# Patient Record
Sex: Female | Born: 1955 | Race: White | Hispanic: No | Marital: Married | Smoking: Never smoker
Health system: Southern US, Community
[De-identification: ages and names within clinical notes are randomized; demographics above are authoritative.]

## PROBLEM LIST (undated history)

## (undated) DIAGNOSIS — C801 Malignant (primary) neoplasm, unspecified: Secondary | ICD-10-CM

## (undated) DIAGNOSIS — C8200 Follicular lymphoma grade I, unspecified site: Secondary | ICD-10-CM

## (undated) DIAGNOSIS — E0590 Thyrotoxicosis, unspecified without thyrotoxic crisis or storm: Secondary | ICD-10-CM

## (undated) HISTORY — DX: Follicular lymphoma grade I, unspecified site: C82.00

## (undated) HISTORY — DX: Thyrotoxicosis, unspecified without thyrotoxic crisis or storm: E05.90

---

## 1997-09-11 ENCOUNTER — Ambulatory Visit (HOSPITAL_COMMUNITY): Admission: RE | Admit: 1997-09-11 | Discharge: 1997-09-11 | Payer: Self-pay | Admitting: Obstetrics & Gynecology

## 1998-06-22 ENCOUNTER — Other Ambulatory Visit: Admission: RE | Admit: 1998-06-22 | Discharge: 1998-06-22 | Payer: Self-pay | Admitting: Obstetrics & Gynecology

## 1999-01-16 ENCOUNTER — Inpatient Hospital Stay (HOSPITAL_COMMUNITY): Admission: AD | Admit: 1999-01-16 | Discharge: 1999-01-19 | Payer: Self-pay | Admitting: *Deleted

## 1999-01-16 ENCOUNTER — Encounter (INDEPENDENT_AMBULATORY_CARE_PROVIDER_SITE_OTHER): Payer: Self-pay | Admitting: Specialist

## 1999-01-20 ENCOUNTER — Encounter (HOSPITAL_COMMUNITY): Admission: RE | Admit: 1999-01-20 | Discharge: 1999-04-20 | Payer: Self-pay | Admitting: *Deleted

## 2000-03-13 ENCOUNTER — Other Ambulatory Visit: Admission: RE | Admit: 2000-03-13 | Discharge: 2000-03-13 | Payer: Self-pay | Admitting: Obstetrics & Gynecology

## 2001-03-28 ENCOUNTER — Other Ambulatory Visit: Admission: RE | Admit: 2001-03-28 | Discharge: 2001-03-28 | Payer: Self-pay | Admitting: Obstetrics & Gynecology

## 2002-05-22 ENCOUNTER — Other Ambulatory Visit: Admission: RE | Admit: 2002-05-22 | Discharge: 2002-05-22 | Payer: Self-pay | Admitting: Obstetrics & Gynecology

## 2003-07-09 ENCOUNTER — Other Ambulatory Visit: Admission: RE | Admit: 2003-07-09 | Discharge: 2003-07-09 | Payer: Self-pay | Admitting: Obstetrics & Gynecology

## 2004-12-02 ENCOUNTER — Other Ambulatory Visit: Admission: RE | Admit: 2004-12-02 | Discharge: 2004-12-02 | Payer: Self-pay | Admitting: Obstetrics & Gynecology

## 2005-01-14 ENCOUNTER — Encounter: Admission: RE | Admit: 2005-01-14 | Discharge: 2005-04-14 | Payer: Self-pay | Admitting: Family Medicine

## 2009-01-05 ENCOUNTER — Other Ambulatory Visit: Admission: RE | Admit: 2009-01-05 | Discharge: 2009-01-05 | Payer: Self-pay | Admitting: Family Medicine

## 2009-12-11 ENCOUNTER — Encounter: Admission: RE | Admit: 2009-12-11 | Discharge: 2009-12-11 | Payer: Self-pay | Admitting: Obstetrics & Gynecology

## 2009-12-15 ENCOUNTER — Ambulatory Visit: Payer: Self-pay | Admitting: Oncology

## 2009-12-18 LAB — CBC WITH DIFFERENTIAL/PLATELET
BASO%: 1.1 % (ref 0.0–2.0)
Basophils Absolute: 0.1 10*3/uL (ref 0.0–0.1)
EOS%: 1.8 % (ref 0.0–7.0)
HCT: 37.7 % (ref 34.8–46.6)
HGB: 13 g/dL (ref 11.6–15.9)
LYMPH%: 26.7 % (ref 14.0–49.7)
MCH: 30.9 pg (ref 25.1–34.0)
MCHC: 34.5 g/dL (ref 31.5–36.0)
MONO#: 0.4 10*3/uL (ref 0.1–0.9)
NEUT%: 63.1 % (ref 38.4–76.8)
Platelets: 235 10*3/uL (ref 145–400)
lymph#: 1.6 10*3/uL (ref 0.9–3.3)

## 2009-12-22 LAB — COMPREHENSIVE METABOLIC PANEL
BUN: 17 mg/dL (ref 6–23)
CO2: 24 mEq/L (ref 19–32)
Calcium: 9.9 mg/dL (ref 8.4–10.5)
Chloride: 109 mEq/L (ref 96–112)
Creatinine, Ser: 0.64 mg/dL (ref 0.40–1.20)
Total Bilirubin: 0.3 mg/dL (ref 0.3–1.2)

## 2009-12-22 LAB — BETA 2 MICROGLOBULIN, SERUM: Beta-2 Microglobulin: 1.78 mg/L — ABNORMAL HIGH (ref 1.01–1.73)

## 2009-12-22 LAB — URIC ACID: Uric Acid, Serum: 4.8 mg/dL (ref 2.4–7.0)

## 2009-12-22 LAB — LACTATE DEHYDROGENASE: LDH: 150 U/L (ref 94–250)

## 2009-12-22 LAB — HEPATITIS B CORE ANTIBODY, IGM: Hep B C IgM: NEGATIVE

## 2009-12-22 LAB — HEPATITIS C ANTIBODY: HCV Ab: NEGATIVE

## 2009-12-28 ENCOUNTER — Ambulatory Visit (HOSPITAL_COMMUNITY): Admission: RE | Admit: 2009-12-28 | Discharge: 2009-12-28 | Payer: Self-pay | Admitting: Oncology

## 2010-01-04 ENCOUNTER — Ambulatory Visit (HOSPITAL_COMMUNITY): Admission: RE | Admit: 2010-01-04 | Discharge: 2010-01-04 | Payer: Self-pay | Admitting: Oncology

## 2010-01-06 ENCOUNTER — Ambulatory Visit (HOSPITAL_COMMUNITY): Admission: RE | Admit: 2010-01-06 | Discharge: 2010-01-06 | Payer: Self-pay | Admitting: Oncology

## 2010-01-29 ENCOUNTER — Ambulatory Visit: Payer: Self-pay | Admitting: Oncology

## 2010-02-03 LAB — COMPREHENSIVE METABOLIC PANEL
ALT: 23 U/L (ref 0–35)
AST: 23 U/L (ref 0–37)
Albumin: 4.1 g/dL (ref 3.5–5.2)
Alkaline Phosphatase: 61 U/L (ref 39–117)
Potassium: 3.8 mEq/L (ref 3.5–5.3)
Sodium: 142 mEq/L (ref 135–145)
Total Bilirubin: 0.6 mg/dL (ref 0.3–1.2)
Total Protein: 7 g/dL (ref 6.0–8.3)

## 2010-02-03 LAB — CBC WITH DIFFERENTIAL/PLATELET
BASO%: 0.9 % (ref 0.0–2.0)
EOS%: 2.4 % (ref 0.0–7.0)
Eosinophils Absolute: 0.2 10*3/uL (ref 0.0–0.5)
LYMPH%: 29.2 % (ref 14.0–49.7)
MCH: 30.8 pg (ref 25.1–34.0)
MCHC: 34.1 g/dL (ref 31.5–36.0)
MCV: 90.4 fL (ref 79.5–101.0)
MONO%: 8.2 % (ref 0.0–14.0)
NEUT#: 3.9 10*3/uL (ref 1.5–6.5)
RBC: 4.28 10*6/uL (ref 3.70–5.45)
RDW: 12.7 % (ref 11.2–14.5)

## 2010-02-04 LAB — HEPATITIS B CORE ANTIBODY, IGM: Hep B C IgM: NEGATIVE

## 2010-02-04 LAB — HEPATITIS C ANTIBODY: HCV Ab: NEGATIVE

## 2010-02-05 ENCOUNTER — Ambulatory Visit (HOSPITAL_COMMUNITY): Admission: RE | Admit: 2010-02-05 | Discharge: 2010-02-05 | Payer: Self-pay | Admitting: Oncology

## 2010-02-05 ENCOUNTER — Other Ambulatory Visit: Admission: RE | Admit: 2010-02-05 | Discharge: 2010-02-05 | Payer: Self-pay | Admitting: Oncology

## 2010-02-05 LAB — PHOSPHORUS: Phosphorus: 3.2 mg/dL (ref 2.3–4.6)

## 2010-02-05 LAB — HEPATITIS B DNA, ULTRAQUANTITATIVE, PCR

## 2010-02-05 LAB — SEDIMENTATION RATE: Sed Rate: 4 mm/hr (ref 0–22)

## 2010-02-05 LAB — TSH: TSH: 1.231 u[IU]/mL (ref 0.350–4.500)

## 2010-02-06 LAB — URIC ACID: Uric Acid, Serum: 4.6 mg/dL (ref 2.4–7.0)

## 2010-02-17 LAB — CBC WITH DIFFERENTIAL/PLATELET
Basophils Absolute: 0.1 10*3/uL (ref 0.0–0.1)
Eosinophils Absolute: 0.1 10*3/uL (ref 0.0–0.5)
HCT: 41 % (ref 34.8–46.6)
HGB: 13.6 g/dL (ref 11.6–15.9)
MCH: 30 pg (ref 25.1–34.0)
NEUT#: 3.3 10*3/uL (ref 1.5–6.5)
NEUT%: 60.5 % (ref 38.4–76.8)
RDW: 12.5 % (ref 11.2–14.5)
lymph#: 1.5 10*3/uL (ref 0.9–3.3)

## 2010-02-17 LAB — COMPREHENSIVE METABOLIC PANEL
ALT: 18 U/L (ref 0–35)
AST: 19 U/L (ref 0–37)
Alkaline Phosphatase: 63 U/L (ref 39–117)
Creatinine, Ser: 0.71 mg/dL (ref 0.40–1.20)
Sodium: 139 mEq/L (ref 135–145)
Total Bilirubin: 0.3 mg/dL (ref 0.3–1.2)

## 2010-02-26 LAB — COMPREHENSIVE METABOLIC PANEL
CO2: 32 mEq/L (ref 19–32)
Calcium: 9.9 mg/dL (ref 8.4–10.5)
Chloride: 105 mEq/L (ref 96–112)
Glucose, Bld: 94 mg/dL (ref 70–99)
Sodium: 142 mEq/L (ref 135–145)
Total Bilirubin: 0.6 mg/dL (ref 0.3–1.2)
Total Protein: 6.3 g/dL (ref 6.0–8.3)

## 2010-02-26 LAB — CBC WITH DIFFERENTIAL/PLATELET
Eosinophils Absolute: 0.3 10*3/uL (ref 0.0–0.5)
HCT: 37.3 % (ref 34.8–46.6)
LYMPH%: 16.6 % (ref 14.0–49.7)
MONO#: 0.6 10*3/uL (ref 0.1–0.9)
NEUT#: 3.8 10*3/uL (ref 1.5–6.5)
NEUT%: 66.6 % (ref 38.4–76.8)
Platelets: 222 10*3/uL (ref 145–400)
RBC: 4.13 10*6/uL (ref 3.70–5.45)
WBC: 5.7 10*3/uL (ref 3.9–10.3)
lymph#: 0.9 10*3/uL (ref 0.9–3.3)

## 2010-02-26 LAB — PHOSPHORUS: Phosphorus: 3.3 mg/dL (ref 2.3–4.6)

## 2010-02-26 LAB — LACTATE DEHYDROGENASE: LDH: 175 U/L (ref 94–250)

## 2010-03-03 ENCOUNTER — Ambulatory Visit: Payer: Self-pay | Admitting: Oncology

## 2010-03-05 LAB — CBC WITH DIFFERENTIAL/PLATELET
BASO%: 0.2 % (ref 0.0–2.0)
EOS%: 2.4 % (ref 0.0–7.0)
Eosinophils Absolute: 0.2 10*3/uL (ref 0.0–0.5)
LYMPH%: 17.7 % (ref 14.0–49.7)
MCH: 30.6 pg (ref 25.1–34.0)
MCHC: 33.6 g/dL (ref 31.5–36.0)
MCV: 91.1 fL (ref 79.5–101.0)
MONO%: 8.2 % (ref 0.0–14.0)
NEUT#: 5.7 10*3/uL (ref 1.5–6.5)
Platelets: 193 10*3/uL (ref 145–400)
RBC: 3.96 10*6/uL (ref 3.70–5.45)
RDW: 12.8 % (ref 11.2–14.5)

## 2010-03-05 LAB — COMPREHENSIVE METABOLIC PANEL
ALT: 26 U/L (ref 0–35)
AST: 27 U/L (ref 0–37)
Albumin: 3.4 g/dL — ABNORMAL LOW (ref 3.5–5.2)
Alkaline Phosphatase: 63 U/L (ref 39–117)
Potassium: 3.7 mEq/L (ref 3.5–5.3)
Sodium: 142 mEq/L (ref 135–145)
Total Bilirubin: 0.3 mg/dL (ref 0.3–1.2)
Total Protein: 6.1 g/dL (ref 6.0–8.3)

## 2010-03-12 LAB — COMPREHENSIVE METABOLIC PANEL
AST: 22 U/L (ref 0–37)
Albumin: 3.3 g/dL — ABNORMAL LOW (ref 3.5–5.2)
BUN: 10 mg/dL (ref 6–23)
Calcium: 9.1 mg/dL (ref 8.4–10.5)
Chloride: 108 mEq/L (ref 96–112)
Creatinine, Ser: 0.67 mg/dL (ref 0.40–1.20)
Glucose, Bld: 108 mg/dL — ABNORMAL HIGH (ref 70–99)
Potassium: 3.6 mEq/L (ref 3.5–5.3)

## 2010-03-12 LAB — CBC WITH DIFFERENTIAL/PLATELET
Basophils Absolute: 0 10*3/uL (ref 0.0–0.1)
EOS%: 5.7 % (ref 0.0–7.0)
Eosinophils Absolute: 0.2 10*3/uL (ref 0.0–0.5)
HCT: 35.2 % (ref 34.8–46.6)
HGB: 12 g/dL (ref 11.6–15.9)
MCH: 30.9 pg (ref 25.1–34.0)
MCV: 90.8 fL (ref 79.5–101.0)
MONO%: 14.9 % — ABNORMAL HIGH (ref 0.0–14.0)
NEUT#: 1.4 10*3/uL — ABNORMAL LOW (ref 1.5–6.5)
NEUT%: 49.3 % (ref 38.4–76.8)
RDW: 12.9 % (ref 11.2–14.5)
lymph#: 0.8 10*3/uL — ABNORMAL LOW (ref 0.9–3.3)

## 2010-03-12 LAB — URIC ACID: Uric Acid, Serum: 3.5 mg/dL (ref 2.4–7.0)

## 2010-03-12 LAB — LACTATE DEHYDROGENASE: LDH: 160 U/L (ref 94–250)

## 2010-03-19 LAB — COMPREHENSIVE METABOLIC PANEL
ALT: 28 U/L (ref 0–35)
Albumin: 3.7 g/dL (ref 3.5–5.2)
Alkaline Phosphatase: 66 U/L (ref 39–117)
CO2: 32 mEq/L (ref 19–32)
Glucose, Bld: 91 mg/dL (ref 70–99)
Potassium: 3.7 mEq/L (ref 3.5–5.3)
Sodium: 141 mEq/L (ref 135–145)
Total Bilirubin: 0.6 mg/dL (ref 0.3–1.2)
Total Protein: 6.5 g/dL (ref 6.0–8.3)

## 2010-03-19 LAB — CBC WITH DIFFERENTIAL/PLATELET
BASO%: 1.9 % (ref 0.0–2.0)
Eosinophils Absolute: 0.1 10*3/uL (ref 0.0–0.5)
LYMPH%: 28.1 % (ref 14.0–49.7)
MCHC: 33.8 g/dL (ref 31.5–36.0)
MONO#: 0.8 10*3/uL (ref 0.1–0.9)
NEUT#: 3.2 10*3/uL (ref 1.5–6.5)
RBC: 4 10*6/uL (ref 3.70–5.45)
RDW: 12.9 % (ref 11.2–14.5)
WBC: 5.9 10*3/uL (ref 3.9–10.3)

## 2010-03-19 LAB — URIC ACID: Uric Acid, Serum: 3.2 mg/dL (ref 2.4–7.0)

## 2010-03-19 LAB — PHOSPHORUS: Phosphorus: 3.7 mg/dL (ref 2.3–4.6)

## 2010-03-19 LAB — LACTATE DEHYDROGENASE: LDH: 171 U/L (ref 94–250)

## 2010-04-14 ENCOUNTER — Ambulatory Visit: Payer: Self-pay | Admitting: Oncology

## 2010-04-16 LAB — CBC WITH DIFFERENTIAL/PLATELET
Basophils Absolute: 0.2 10*3/uL — ABNORMAL HIGH (ref 0.0–0.1)
Eosinophils Absolute: 0.1 10*3/uL (ref 0.0–0.5)
HGB: 12.1 g/dL (ref 11.6–15.9)
MCV: 90.3 fL (ref 79.5–101.0)
MONO#: 0.9 10*3/uL (ref 0.1–0.9)
NEUT#: 2.8 10*3/uL (ref 1.5–6.5)
RDW: 12.3 % (ref 11.2–14.5)
lymph#: 1.3 10*3/uL (ref 0.9–3.3)

## 2010-04-16 LAB — COMPREHENSIVE METABOLIC PANEL
Albumin: 3.3 g/dL — ABNORMAL LOW (ref 3.5–5.2)
BUN: 20 mg/dL (ref 6–23)
CO2: 28 mEq/L (ref 19–32)
Calcium: 9.7 mg/dL (ref 8.4–10.5)
Chloride: 105 mEq/L (ref 96–112)
Glucose, Bld: 107 mg/dL — ABNORMAL HIGH (ref 70–99)
Potassium: 4.1 mEq/L (ref 3.5–5.3)

## 2010-04-16 LAB — LACTATE DEHYDROGENASE: LDH: 139 U/L (ref 94–250)

## 2010-04-26 ENCOUNTER — Ambulatory Visit (HOSPITAL_COMMUNITY): Admission: RE | Admit: 2010-04-26 | Payer: Self-pay | Source: Home / Self Care | Admitting: Oncology

## 2010-04-29 ENCOUNTER — Ambulatory Visit (HOSPITAL_COMMUNITY)
Admission: RE | Admit: 2010-04-29 | Discharge: 2010-04-29 | Payer: Self-pay | Source: Home / Self Care | Attending: Oncology | Admitting: Oncology

## 2010-04-29 LAB — COMPREHENSIVE METABOLIC PANEL
BUN: 23 mg/dL (ref 6–23)
CO2: 29 mEq/L (ref 19–32)
Calcium: 9.8 mg/dL (ref 8.4–10.5)
Chloride: 106 mEq/L (ref 96–112)
Creatinine, Ser: 0.51 mg/dL (ref 0.40–1.20)
Glucose, Bld: 127 mg/dL — ABNORMAL HIGH (ref 70–99)
Total Bilirubin: 0.2 mg/dL — ABNORMAL LOW (ref 0.3–1.2)

## 2010-04-29 LAB — URIC ACID: Uric Acid, Serum: 4.1 mg/dL (ref 2.4–7.0)

## 2010-04-29 LAB — CBC WITH DIFFERENTIAL/PLATELET
Eosinophils Absolute: 0.1 10*3/uL (ref 0.0–0.5)
HCT: 36.9 % (ref 34.8–46.6)
LYMPH%: 25.9 % (ref 14.0–49.7)
MCV: 90.2 fL (ref 79.5–101.0)
MONO%: 17.5 % — ABNORMAL HIGH (ref 0.0–14.0)
NEUT#: 2.9 10*3/uL (ref 1.5–6.5)
NEUT%: 53.3 % (ref 38.4–76.8)
Platelets: 263 10*3/uL (ref 145–400)
RBC: 4.09 10*6/uL (ref 3.70–5.45)
nRBC: 0 % (ref 0–0)

## 2010-04-29 LAB — LACTATE DEHYDROGENASE: LDH: 112 U/L (ref 94–250)

## 2010-04-29 LAB — PHOSPHORUS: Phosphorus: 4.3 mg/dL (ref 2.3–4.6)

## 2010-05-04 ENCOUNTER — Encounter (HOSPITAL_COMMUNITY)
Admission: RE | Admit: 2010-05-04 | Discharge: 2010-06-15 | Payer: Self-pay | Source: Home / Self Care | Attending: Internal Medicine | Admitting: Internal Medicine

## 2010-05-14 ENCOUNTER — Ambulatory Visit: Payer: Self-pay | Admitting: Oncology

## 2010-05-14 LAB — CBC WITH DIFFERENTIAL/PLATELET
Basophils Absolute: 0 10*3/uL (ref 0.0–0.1)
Eosinophils Absolute: 0.3 10*3/uL (ref 0.0–0.5)
HCT: 34.9 % (ref 34.8–46.6)
HGB: 11.9 g/dL (ref 11.6–15.9)
LYMPH%: 27.9 % (ref 14.0–49.7)
MONO#: 0.6 10*3/uL (ref 0.1–0.9)
NEUT#: 3.1 10*3/uL (ref 1.5–6.5)
Platelets: 238 10*3/uL (ref 145–400)
RBC: 3.95 10*6/uL (ref 3.70–5.45)
WBC: 5.6 10*3/uL (ref 3.9–10.3)

## 2010-05-14 LAB — COMPREHENSIVE METABOLIC PANEL
ALT: 24 U/L (ref 0–35)
AST: 23 U/L (ref 0–37)
CO2: 26 mEq/L (ref 19–32)
Calcium: 9.7 mg/dL (ref 8.4–10.5)
Chloride: 106 mEq/L (ref 96–112)
Sodium: 139 mEq/L (ref 135–145)
Total Bilirubin: 0.4 mg/dL (ref 0.3–1.2)
Total Protein: 5.8 g/dL — ABNORMAL LOW (ref 6.0–8.3)

## 2010-05-14 LAB — URIC ACID: Uric Acid, Serum: 4.4 mg/dL (ref 2.4–7.0)

## 2010-06-06 ENCOUNTER — Encounter: Payer: Self-pay | Admitting: Oncology

## 2010-06-11 LAB — COMPREHENSIVE METABOLIC PANEL
AST: 21 U/L (ref 0–37)
Albumin: 3.3 g/dL — ABNORMAL LOW (ref 3.5–5.2)
Alkaline Phosphatase: 69 U/L (ref 39–117)
BUN: 19 mg/dL (ref 6–23)
Glucose, Bld: 91 mg/dL (ref 70–99)
Potassium: 4.2 mEq/L (ref 3.5–5.3)
Sodium: 139 mEq/L (ref 135–145)
Total Bilirubin: 0.3 mg/dL (ref 0.3–1.2)
Total Protein: 5.8 g/dL — ABNORMAL LOW (ref 6.0–8.3)

## 2010-06-11 LAB — CBC WITH DIFFERENTIAL/PLATELET
EOS%: 1.7 % (ref 0.0–7.0)
LYMPH%: 29.1 % (ref 14.0–49.7)
MCH: 30 pg (ref 25.1–34.0)
MCV: 90.5 fL (ref 79.5–101.0)
MONO%: 15.3 % — ABNORMAL HIGH (ref 0.0–14.0)
Platelets: 238 10*3/uL (ref 145–400)
RBC: 4.03 10*6/uL (ref 3.70–5.45)
RDW: 15.7 % — ABNORMAL HIGH (ref 11.2–14.5)

## 2010-06-11 LAB — PHOSPHORUS: Phosphorus: 3.9 mg/dL (ref 2.3–4.6)

## 2010-06-11 LAB — URIC ACID: Uric Acid, Serum: 3.9 mg/dL (ref 2.4–7.0)

## 2010-06-19 ENCOUNTER — Emergency Department (HOSPITAL_COMMUNITY): Payer: No Typology Code available for payment source

## 2010-06-19 ENCOUNTER — Inpatient Hospital Stay (HOSPITAL_COMMUNITY)
Admission: EM | Admit: 2010-06-19 | Discharge: 2010-06-22 | DRG: 086 | Disposition: A | Discharge status: Home / Self Care | Payer: No Typology Code available for payment source | Attending: General Surgery | Admitting: General Surgery

## 2010-06-19 DIAGNOSIS — C8589 Other specified types of non-Hodgkin lymphoma, extranodal and solid organ sites: Secondary | ICD-10-CM | POA: Diagnosis present

## 2010-06-19 DIAGNOSIS — S06330A Contusion and laceration of cerebrum, unspecified, without loss of consciousness, initial encounter: Principal | ICD-10-CM | POA: Diagnosis present

## 2010-06-19 DIAGNOSIS — S0633AA Contusion and laceration of cerebrum, unspecified, with loss of consciousness status unknown, initial encounter: Principal | ICD-10-CM | POA: Diagnosis present

## 2010-06-19 LAB — CBC
HCT: 45.4 % (ref 36.0–46.0)
Hemoglobin: 14.7 g/dL (ref 12.0–15.0)
MCH: 29.8 pg (ref 26.0–34.0)
MCHC: 32.4 g/dL (ref 30.0–36.0)
MCV: 91.9 fL (ref 78.0–100.0)
Platelets: 233 K/uL (ref 150–400)
RBC: 4.94 MIL/uL (ref 3.87–5.11)
RDW: 15.6 % — ABNORMAL HIGH (ref 11.5–15.5)
WBC: 7.8 10*3/uL (ref 4.0–10.5)

## 2010-06-19 LAB — DIFFERENTIAL
Basophils Absolute: 0.2 K/uL — ABNORMAL HIGH (ref 0.0–0.1)
Basophils Relative: 2 % — ABNORMAL HIGH (ref 0–1)
Eosinophils Absolute: 0.2 K/uL (ref 0.0–0.7)
Eosinophils Relative: 3 % (ref 0–5)
Lymphocytes Relative: 16 % (ref 12–46)
Lymphs Abs: 1.2 K/uL (ref 0.7–4.0)
Monocytes Absolute: 0.5 10*3/uL (ref 0.1–1.0)
Monocytes Relative: 7 % (ref 3–12)
Neutro Abs: 5.7 10*3/uL (ref 1.7–7.7)
Neutrophils Relative %: 73 % (ref 43–77)

## 2010-06-19 LAB — COMPREHENSIVE METABOLIC PANEL
AST: 27 U/L (ref 0–37)
Albumin: 4 g/dL (ref 3.5–5.2)
Alkaline Phosphatase: 105 U/L (ref 39–117)
Chloride: 100 mEq/L (ref 96–112)
GFR calc Af Amer: >60 mL/min (ref >60)
Potassium: 3.8 mEq/L (ref 3.5–5.1)
Sodium: 137 mEq/L (ref 135–145)
Total Bilirubin: 0.7 mg/dL (ref 0.3–1.2)

## 2010-06-19 LAB — COMPREHENSIVE METABOLIC PANEL WITH GFR
ALT: 22 U/L (ref 0–35)
BUN: 10 mg/dL (ref 6–23)
CO2: 27 meq/L (ref 19–32)
Calcium: 9.3 mg/dL (ref 8.4–10.5)
Creatinine, Ser: 0.7 mg/dL (ref 0.4–1.2)
GFR calc non Af Amer: >60 mL/min (ref >60)
Glucose, Bld: 120 mg/dL — ABNORMAL HIGH (ref 70–99)
Total Protein: 7 g/dL (ref 6.0–8.3)

## 2010-06-19 LAB — PROTIME-INR
INR: 0.88 (ref 0.00–1.49)
Prothrombin Time: 12.1 s (ref 11.6–15.2)

## 2010-06-19 LAB — APTT: aPTT: 25 seconds (ref 24–37)

## 2010-06-20 ENCOUNTER — Encounter (HOSPITAL_COMMUNITY): Payer: Self-pay | Admitting: Radiology

## 2010-06-20 ENCOUNTER — Inpatient Hospital Stay (HOSPITAL_COMMUNITY): Payer: No Typology Code available for payment source

## 2010-06-20 LAB — BASIC METABOLIC PANEL
CO2: 25 mEq/L (ref 19–32)
Calcium: 9 mg/dL (ref 8.4–10.5)
Creatinine, Ser: 0.68 mg/dL (ref 0.4–1.2)
Glucose, Bld: 126 mg/dL — ABNORMAL HIGH (ref 70–99)

## 2010-06-21 LAB — BASIC METABOLIC PANEL
BUN: 7 mg/dL (ref 6–23)
CO2: 25 mEq/L (ref 19–32)
Calcium: 8.9 mg/dL (ref 8.4–10.5)
Creatinine, Ser: 0.61 mg/dL (ref 0.4–1.2)
GFR calc Af Amer: >60 mL/min (ref >60)

## 2010-06-21 LAB — CBC
Hemoglobin: 13.1 g/dL (ref 12.0–15.0)
MCH: 29.2 pg (ref 26.0–34.0)
MCHC: 31.9 g/dL (ref 30.0–36.0)
MCV: 91.7 fL (ref 78.0–100.0)

## 2010-06-22 ENCOUNTER — Inpatient Hospital Stay (HOSPITAL_COMMUNITY): Payer: No Typology Code available for payment source

## 2010-06-23 ENCOUNTER — Other Ambulatory Visit: Payer: Self-pay | Admitting: Neurosurgery

## 2010-06-23 DIAGNOSIS — I619 Nontraumatic intracerebral hemorrhage, unspecified: Secondary | ICD-10-CM

## 2010-06-30 ENCOUNTER — Ambulatory Visit
Admission: RE | Admit: 2010-06-30 | Discharge: 2010-06-30 | Disposition: A | Discharge status: Home / Self Care | Payer: BC Managed Care – PPO | Source: Ambulatory Visit | Attending: Neurosurgery | Admitting: Neurosurgery

## 2010-06-30 DIAGNOSIS — I619 Nontraumatic intracerebral hemorrhage, unspecified: Secondary | ICD-10-CM

## 2010-07-09 ENCOUNTER — Encounter (HOSPITAL_BASED_OUTPATIENT_CLINIC_OR_DEPARTMENT_OTHER): Payer: BC Managed Care – PPO | Admitting: Oncology

## 2010-07-09 ENCOUNTER — Other Ambulatory Visit: Payer: Self-pay | Admitting: Oncology

## 2010-07-09 DIAGNOSIS — E8809 Other disorders of plasma-protein metabolism, not elsewhere classified: Secondary | ICD-10-CM

## 2010-07-09 DIAGNOSIS — E041 Nontoxic single thyroid nodule: Secondary | ICD-10-CM

## 2010-07-09 DIAGNOSIS — R079 Chest pain, unspecified: Secondary | ICD-10-CM

## 2010-07-09 DIAGNOSIS — R7309 Other abnormal glucose: Secondary | ICD-10-CM

## 2010-07-09 DIAGNOSIS — C8590 Non-Hodgkin lymphoma, unspecified, unspecified site: Secondary | ICD-10-CM

## 2010-07-09 DIAGNOSIS — C8298 Follicular lymphoma, unspecified, lymph nodes of multiple sites: Secondary | ICD-10-CM

## 2010-07-09 DIAGNOSIS — Z5112 Encounter for antineoplastic immunotherapy: Secondary | ICD-10-CM

## 2010-07-09 LAB — CBC WITH DIFFERENTIAL/PLATELET
BASO%: 3.2 % — ABNORMAL HIGH (ref 0.0–2.0)
EOS%: 2 % (ref 0.0–7.0)
HCT: 42.7 % (ref 34.8–46.6)
MCH: 30.3 pg (ref 25.1–34.0)
MCHC: 34.1 g/dL (ref 31.5–36.0)
MONO#: 0.8 10*3/uL (ref 0.1–0.9)
NEUT%: 56.5 % (ref 38.4–76.8)
RBC: 4.82 10*6/uL (ref 3.70–5.45)
RDW: 15.5 % — ABNORMAL HIGH (ref 11.2–14.5)
WBC: 5.5 10*3/uL (ref 3.9–10.3)
lymph#: 1.3 10*3/uL (ref 0.9–3.3)

## 2010-07-09 LAB — COMPREHENSIVE METABOLIC PANEL
ALT: 35 U/L (ref 0–35)
AST: 29 U/L (ref 0–37)
CO2: 26 mEq/L (ref 19–32)
Calcium: 9.5 mg/dL (ref 8.4–10.5)
Chloride: 105 mEq/L (ref 96–112)
Creatinine, Ser: 0.7 mg/dL (ref 0.40–1.20)
Sodium: 138 mEq/L (ref 135–145)
Total Protein: 6.5 g/dL (ref 6.0–8.3)

## 2010-07-09 LAB — RESEARCH LABS

## 2010-07-14 DIAGNOSIS — S069X9A Unspecified intracranial injury with loss of consciousness of unspecified duration, initial encounter: Secondary | ICD-10-CM

## 2010-07-14 DIAGNOSIS — F0781 Postconcussional syndrome: Secondary | ICD-10-CM

## 2010-07-26 LAB — GLUCOSE, CAPILLARY: Glucose-Capillary: 126 mg/dL — ABNORMAL HIGH (ref 70–99)

## 2010-07-29 LAB — CHROMOSOME ANALYSIS, BONE MARROW

## 2010-07-29 LAB — BONE MARROW EXAM

## 2010-07-30 LAB — GLUCOSE, CAPILLARY: Glucose-Capillary: 109 mg/dL — ABNORMAL HIGH (ref 70–99)
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
## 2010-08-05 ENCOUNTER — Encounter (HOSPITAL_COMMUNITY)
Admission: RE | Admit: 2010-08-05 | Discharge: 2010-08-05 | Disposition: A | Discharge status: Home / Self Care | Payer: BC Managed Care – PPO | Source: Ambulatory Visit | Attending: Oncology | Admitting: Oncology

## 2010-08-05 ENCOUNTER — Encounter (HOSPITAL_COMMUNITY): Payer: Self-pay

## 2010-08-05 DIAGNOSIS — C8590 Non-Hodgkin lymphoma, unspecified, unspecified site: Secondary | ICD-10-CM

## 2010-08-05 DIAGNOSIS — C8589 Other specified types of non-Hodgkin lymphoma, extranodal and solid organ sites: Secondary | ICD-10-CM | POA: Insufficient documentation

## 2010-08-05 HISTORY — DX: Malignant (primary) neoplasm, unspecified: C80.1

## 2010-08-05 LAB — GLUCOSE, CAPILLARY: Glucose-Capillary: 96 mg/dL (ref 70–99)

## 2010-08-05 MED ORDER — FLUDEOXYGLUCOSE F - 18 (FDG) INJECTION
17.9000 | Freq: Once | INTRAVENOUS | Status: AC | PRN
  Administered 2010-08-05: 17.9 via INTRAVENOUS

## 2010-08-06 ENCOUNTER — Encounter (HOSPITAL_BASED_OUTPATIENT_CLINIC_OR_DEPARTMENT_OTHER): Payer: BC Managed Care – PPO | Admitting: Oncology

## 2010-08-06 ENCOUNTER — Other Ambulatory Visit: Payer: Self-pay | Admitting: Oncology

## 2010-08-06 DIAGNOSIS — E041 Nontoxic single thyroid nodule: Secondary | ICD-10-CM

## 2010-08-06 DIAGNOSIS — E8809 Other disorders of plasma-protein metabolism, not elsewhere classified: Secondary | ICD-10-CM

## 2010-08-06 DIAGNOSIS — C8298 Follicular lymphoma, unspecified, lymph nodes of multiple sites: Secondary | ICD-10-CM

## 2010-08-06 DIAGNOSIS — R7309 Other abnormal glucose: Secondary | ICD-10-CM

## 2010-08-06 LAB — CBC WITH DIFFERENTIAL/PLATELET
BASO%: 2.8 % — ABNORMAL HIGH (ref 0.0–2.0)
EOS%: 1.4 % (ref 0.0–7.0)
MCH: 30 pg (ref 25.1–34.0)
MCHC: 33.6 g/dL (ref 31.5–36.0)
MCV: 89.4 fL (ref 79.5–101.0)
MONO%: 11.8 % (ref 0.0–14.0)
RDW: 14.4 % (ref 11.2–14.5)
lymph#: 1.6 10*3/uL (ref 0.9–3.3)

## 2010-08-06 LAB — COMPREHENSIVE METABOLIC PANEL
ALT: 28 U/L (ref 0–35)
AST: 22 U/L (ref 0–37)
Albumin: 3.7 g/dL (ref 3.5–5.2)
Alkaline Phosphatase: 99 U/L (ref 39–117)
Calcium: 9.1 mg/dL (ref 8.4–10.5)
Chloride: 104 mEq/L (ref 96–112)
Creatinine, Ser: 0.65 mg/dL (ref 0.40–1.20)
Potassium: 3.7 mEq/L (ref 3.5–5.3)

## 2010-08-06 LAB — PHOSPHORUS: Phosphorus: 3.8 mg/dL (ref 2.3–4.6)

## 2010-08-25 DIAGNOSIS — F0781 Postconcussional syndrome: Secondary | ICD-10-CM

## 2010-08-25 DIAGNOSIS — S069X9A Unspecified intracranial injury with loss of consciousness of unspecified duration, initial encounter: Secondary | ICD-10-CM

## 2010-09-01 DIAGNOSIS — F0781 Postconcussional syndrome: Secondary | ICD-10-CM

## 2010-09-01 DIAGNOSIS — S069X9A Unspecified intracranial injury with loss of consciousness of unspecified duration, initial encounter: Secondary | ICD-10-CM

## 2010-09-03 ENCOUNTER — Other Ambulatory Visit: Payer: Self-pay | Admitting: Oncology

## 2010-09-03 ENCOUNTER — Encounter (HOSPITAL_BASED_OUTPATIENT_CLINIC_OR_DEPARTMENT_OTHER): Payer: BC Managed Care – PPO | Admitting: Oncology

## 2010-09-03 DIAGNOSIS — E8809 Other disorders of plasma-protein metabolism, not elsewhere classified: Secondary | ICD-10-CM

## 2010-09-03 DIAGNOSIS — C8298 Follicular lymphoma, unspecified, lymph nodes of multiple sites: Secondary | ICD-10-CM

## 2010-09-03 DIAGNOSIS — Z5112 Encounter for antineoplastic immunotherapy: Secondary | ICD-10-CM

## 2010-09-03 DIAGNOSIS — R7309 Other abnormal glucose: Secondary | ICD-10-CM

## 2010-09-03 LAB — CBC WITH DIFFERENTIAL/PLATELET
Basophils Absolute: 0.1 10*3/uL (ref 0.0–0.1)
EOS%: 1.9 % (ref 0.0–7.0)
Eosinophils Absolute: 0.1 10*3/uL (ref 0.0–0.5)
HCT: 40 % (ref 34.8–46.6)
HGB: 13.7 g/dL (ref 11.6–15.9)
MCH: 30.8 pg (ref 25.1–34.0)
NEUT#: 2.5 10*3/uL (ref 1.5–6.5)
NEUT%: 60.5 % (ref 38.4–76.8)
RDW: 13.8 % (ref 11.2–14.5)
lymph#: 0.9 10*3/uL (ref 0.9–3.3)

## 2010-09-03 LAB — COMPREHENSIVE METABOLIC PANEL
Albumin: 3.5 g/dL (ref 3.5–5.2)
BUN: 12 mg/dL (ref 6–23)
CO2: 30 mEq/L (ref 19–32)
Calcium: 9.2 mg/dL (ref 8.4–10.5)
Chloride: 105 mEq/L (ref 96–112)
Creatinine, Ser: 0.77 mg/dL (ref 0.40–1.20)
Glucose, Bld: 110 mg/dL — ABNORMAL HIGH (ref 70–99)
Potassium: 3.7 mEq/L (ref 3.5–5.3)

## 2010-09-03 LAB — URIC ACID: Uric Acid, Serum: 3.9 mg/dL (ref 2.4–7.0)

## 2010-09-03 LAB — RESEARCH LABS

## 2010-09-03 LAB — PHOSPHORUS: Phosphorus: 3.4 mg/dL (ref 2.3–4.6)

## 2010-09-03 LAB — LACTATE DEHYDROGENASE: LDH: 149 U/L (ref 94–250)

## 2010-09-22 ENCOUNTER — Other Ambulatory Visit: Payer: Self-pay | Admitting: Internal Medicine

## 2010-09-22 DIAGNOSIS — E042 Nontoxic multinodular goiter: Secondary | ICD-10-CM

## 2010-09-27 ENCOUNTER — Ambulatory Visit
Admission: RE | Admit: 2010-09-27 | Discharge: 2010-09-27 | Disposition: A | Discharge status: Home / Self Care | Payer: BC Managed Care – PPO | Source: Ambulatory Visit | Attending: Internal Medicine | Admitting: Internal Medicine

## 2010-09-27 DIAGNOSIS — E042 Nontoxic multinodular goiter: Secondary | ICD-10-CM

## 2010-10-01 ENCOUNTER — Other Ambulatory Visit: Payer: Self-pay | Admitting: Oncology

## 2010-10-01 ENCOUNTER — Encounter (HOSPITAL_BASED_OUTPATIENT_CLINIC_OR_DEPARTMENT_OTHER): Payer: BC Managed Care – PPO | Admitting: Oncology

## 2010-10-01 DIAGNOSIS — R7309 Other abnormal glucose: Secondary | ICD-10-CM

## 2010-10-01 DIAGNOSIS — Z5112 Encounter for antineoplastic immunotherapy: Secondary | ICD-10-CM

## 2010-10-01 DIAGNOSIS — E8809 Other disorders of plasma-protein metabolism, not elsewhere classified: Secondary | ICD-10-CM

## 2010-10-01 DIAGNOSIS — C8298 Follicular lymphoma, unspecified, lymph nodes of multiple sites: Secondary | ICD-10-CM

## 2010-10-01 LAB — CBC WITH DIFFERENTIAL/PLATELET
BASO%: 2.5 % — ABNORMAL HIGH (ref 0.0–2.0)
EOS%: 1.9 % (ref 0.0–7.0)
HCT: 38 % (ref 34.8–46.6)
MCH: 31 pg (ref 25.1–34.0)
MCHC: 34.1 g/dL (ref 31.5–36.0)
MONO#: 0.6 10*3/uL (ref 0.1–0.9)
NEUT%: 62.7 % (ref 38.4–76.8)
RBC: 4.18 10*6/uL (ref 3.70–5.45)
WBC: 5.4 10*3/uL (ref 3.9–10.3)
lymph#: 1.1 10*3/uL (ref 0.9–3.3)

## 2010-10-01 LAB — COMPREHENSIVE METABOLIC PANEL
ALT: 26 U/L (ref 0–35)
AST: 20 U/L (ref 0–37)
Calcium: 9.6 mg/dL (ref 8.4–10.5)
Chloride: 105 mEq/L (ref 96–112)
Creatinine, Ser: 0.76 mg/dL (ref 0.40–1.20)
Sodium: 141 mEq/L (ref 135–145)
Total Bilirubin: 0.3 mg/dL (ref 0.3–1.2)
Total Protein: 6.4 g/dL (ref 6.0–8.3)

## 2010-10-29 ENCOUNTER — Other Ambulatory Visit: Payer: Self-pay | Admitting: Oncology

## 2010-10-29 ENCOUNTER — Encounter (HOSPITAL_BASED_OUTPATIENT_CLINIC_OR_DEPARTMENT_OTHER): Payer: BC Managed Care – PPO | Admitting: Oncology

## 2010-10-29 DIAGNOSIS — Z5112 Encounter for antineoplastic immunotherapy: Secondary | ICD-10-CM

## 2010-10-29 DIAGNOSIS — R7309 Other abnormal glucose: Secondary | ICD-10-CM

## 2010-10-29 DIAGNOSIS — C8298 Follicular lymphoma, unspecified, lymph nodes of multiple sites: Secondary | ICD-10-CM

## 2010-10-29 DIAGNOSIS — C8584 Other specified types of non-Hodgkin lymphoma, lymph nodes of axilla and upper limb: Secondary | ICD-10-CM

## 2010-10-29 DIAGNOSIS — E8809 Other disorders of plasma-protein metabolism, not elsewhere classified: Secondary | ICD-10-CM

## 2010-10-29 LAB — COMPREHENSIVE METABOLIC PANEL
Albumin: 3.4 g/dL — ABNORMAL LOW (ref 3.5–5.2)
BUN: 16 mg/dL (ref 6–23)
CO2: 28 mEq/L (ref 19–32)
Calcium: 9.4 mg/dL (ref 8.4–10.5)
Chloride: 107 mEq/L (ref 96–112)
Glucose, Bld: 101 mg/dL — ABNORMAL HIGH (ref 70–99)
Potassium: 3.9 mEq/L (ref 3.5–5.3)
Sodium: 142 mEq/L (ref 135–145)
Total Protein: 6.3 g/dL (ref 6.0–8.3)

## 2010-10-29 LAB — PHOSPHORUS: Phosphorus: 2.5 mg/dL (ref 2.3–4.6)

## 2010-10-29 LAB — CBC WITH DIFFERENTIAL/PLATELET
Eosinophils Absolute: 0 10*3/uL (ref 0.0–0.5)
HCT: 36.7 % (ref 34.8–46.6)
LYMPH%: 25.3 % (ref 14.0–49.7)
MCV: 92.5 fL (ref 79.5–101.0)
MONO#: 0.6 10*3/uL (ref 0.1–0.9)
NEUT#: 2.6 10*3/uL (ref 1.5–6.5)
NEUT%: 59.5 % (ref 38.4–76.8)
Platelets: 222 10*3/uL (ref 145–400)
WBC: 4.4 10*3/uL (ref 3.9–10.3)

## 2010-10-29 LAB — URIC ACID: Uric Acid, Serum: 3.7 mg/dL (ref 2.4–7.0)

## 2010-11-26 ENCOUNTER — Encounter (HOSPITAL_BASED_OUTPATIENT_CLINIC_OR_DEPARTMENT_OTHER): Payer: BC Managed Care – PPO | Admitting: Oncology

## 2010-11-26 ENCOUNTER — Other Ambulatory Visit: Payer: Self-pay | Admitting: Oncology

## 2010-11-26 DIAGNOSIS — R7309 Other abnormal glucose: Secondary | ICD-10-CM

## 2010-11-26 DIAGNOSIS — Z5112 Encounter for antineoplastic immunotherapy: Secondary | ICD-10-CM

## 2010-11-26 DIAGNOSIS — C8298 Follicular lymphoma, unspecified, lymph nodes of multiple sites: Secondary | ICD-10-CM

## 2010-11-26 DIAGNOSIS — E8809 Other disorders of plasma-protein metabolism, not elsewhere classified: Secondary | ICD-10-CM

## 2010-11-26 LAB — CBC WITH DIFFERENTIAL/PLATELET
BASO%: 2.7 % — ABNORMAL HIGH (ref 0.0–2.0)
Basophils Absolute: 0.1 10*3/uL (ref 0.0–0.1)
EOS%: 4.6 % (ref 0.0–7.0)
HGB: 12.9 g/dL (ref 11.6–15.9)
MCH: 32 pg (ref 25.1–34.0)
MCHC: 34 g/dL (ref 31.5–36.0)
MCV: 94.2 fL (ref 79.5–101.0)
MONO%: 14.2 % — ABNORMAL HIGH (ref 0.0–14.0)
NEUT%: 54.5 % (ref 38.4–76.8)
RDW: 14.5 % (ref 11.2–14.5)
lymph#: 1.1 10*3/uL (ref 0.9–3.3)

## 2010-11-26 LAB — COMPREHENSIVE METABOLIC PANEL
ALT: 36 U/L — ABNORMAL HIGH (ref 0–35)
Albumin: 3.2 g/dL — ABNORMAL LOW (ref 3.5–5.2)
CO2: 33 mEq/L — ABNORMAL HIGH (ref 19–32)
Calcium: 9.3 mg/dL (ref 8.4–10.5)
Chloride: 105 mEq/L (ref 96–112)
Creatinine, Ser: 0.64 mg/dL (ref 0.50–1.10)
Potassium: 3.5 mEq/L (ref 3.5–5.3)
Total Protein: 6.4 g/dL (ref 6.0–8.3)

## 2010-11-26 LAB — LACTATE DEHYDROGENASE: LDH: 242 U/L (ref 94–250)

## 2010-12-24 ENCOUNTER — Encounter (HOSPITAL_BASED_OUTPATIENT_CLINIC_OR_DEPARTMENT_OTHER): Payer: BC Managed Care – PPO | Admitting: Oncology

## 2010-12-24 ENCOUNTER — Other Ambulatory Visit: Payer: Self-pay | Admitting: Oncology

## 2010-12-24 DIAGNOSIS — R7309 Other abnormal glucose: Secondary | ICD-10-CM

## 2010-12-24 DIAGNOSIS — E8809 Other disorders of plasma-protein metabolism, not elsewhere classified: Secondary | ICD-10-CM

## 2010-12-24 DIAGNOSIS — C8298 Follicular lymphoma, unspecified, lymph nodes of multiple sites: Secondary | ICD-10-CM

## 2010-12-24 DIAGNOSIS — C8590 Non-Hodgkin lymphoma, unspecified, unspecified site: Secondary | ICD-10-CM

## 2010-12-24 LAB — CBC WITH DIFFERENTIAL/PLATELET
BASO%: 3.4 % — ABNORMAL HIGH (ref 0.0–2.0)
EOS%: 2.2 % (ref 0.0–7.0)
HCT: 37.1 % (ref 34.8–46.6)
LYMPH%: 26.3 % (ref 14.0–49.7)
MCH: 32.6 pg (ref 25.1–34.0)
MCHC: 34.5 g/dL (ref 31.5–36.0)
MCV: 94.4 fL (ref 79.5–101.0)
MONO%: 13 % (ref 0.0–14.0)
NEUT%: 55.1 % (ref 38.4–76.8)
Platelets: 235 10*3/uL (ref 145–400)
RBC: 3.93 10*6/uL (ref 3.70–5.45)
WBC: 4.2 10*3/uL (ref 3.9–10.3)

## 2010-12-24 LAB — COMPREHENSIVE METABOLIC PANEL
AST: 22 U/L (ref 0–37)
Albumin: 3.3 g/dL — ABNORMAL LOW (ref 3.5–5.2)
Alkaline Phosphatase: 96 U/L (ref 39–117)
Glucose, Bld: 113 mg/dL — ABNORMAL HIGH (ref 70–99)
Potassium: 4 mEq/L (ref 3.5–5.3)
Sodium: 141 mEq/L (ref 135–145)
Total Bilirubin: 0.2 mg/dL — ABNORMAL LOW (ref 0.3–1.2)
Total Protein: 6.9 g/dL (ref 6.0–8.3)

## 2010-12-24 LAB — URIC ACID: Uric Acid, Serum: 4.6 mg/dL (ref 2.4–7.0)

## 2010-12-24 LAB — LACTATE DEHYDROGENASE: LDH: 236 U/L (ref 94–250)

## 2011-02-09 ENCOUNTER — Other Ambulatory Visit: Payer: Self-pay | Admitting: Oncology

## 2011-02-09 DIAGNOSIS — C8590 Non-Hodgkin lymphoma, unspecified, unspecified site: Secondary | ICD-10-CM

## 2011-02-10 ENCOUNTER — Other Ambulatory Visit (HOSPITAL_COMMUNITY): Payer: Self-pay

## 2011-02-10 ENCOUNTER — Ambulatory Visit (HOSPITAL_COMMUNITY)
Admission: RE | Admit: 2011-02-10 | Discharge: 2011-02-10 | Disposition: A | Discharge status: Home / Self Care | Payer: BC Managed Care – PPO | Source: Ambulatory Visit | Attending: Oncology | Admitting: Oncology

## 2011-02-10 ENCOUNTER — Encounter (HOSPITAL_COMMUNITY): Payer: Self-pay

## 2011-02-10 DIAGNOSIS — K7689 Other specified diseases of liver: Secondary | ICD-10-CM | POA: Insufficient documentation

## 2011-02-10 DIAGNOSIS — E041 Nontoxic single thyroid nodule: Secondary | ICD-10-CM | POA: Insufficient documentation

## 2011-02-10 DIAGNOSIS — C8590 Non-Hodgkin lymphoma, unspecified, unspecified site: Secondary | ICD-10-CM

## 2011-02-10 DIAGNOSIS — C8589 Other specified types of non-Hodgkin lymphoma, extranodal and solid organ sites: Secondary | ICD-10-CM | POA: Insufficient documentation

## 2011-02-10 MED ORDER — IOHEXOL 300 MG/ML  SOLN
100.0000 mL | Freq: Once | INTRAMUSCULAR | Status: AC | PRN
  Administered 2011-02-10: 100 mL via INTRAVENOUS

## 2011-02-11 ENCOUNTER — Other Ambulatory Visit (HOSPITAL_COMMUNITY): Payer: Self-pay

## 2011-02-14 ENCOUNTER — Other Ambulatory Visit: Payer: Self-pay | Admitting: Oncology

## 2011-02-14 ENCOUNTER — Encounter (HOSPITAL_BASED_OUTPATIENT_CLINIC_OR_DEPARTMENT_OTHER): Payer: BC Managed Care – PPO | Admitting: Oncology

## 2011-02-14 DIAGNOSIS — E8809 Other disorders of plasma-protein metabolism, not elsewhere classified: Secondary | ICD-10-CM

## 2011-02-14 DIAGNOSIS — Z8572 Personal history of non-Hodgkin lymphomas: Secondary | ICD-10-CM

## 2011-02-14 DIAGNOSIS — C8298 Follicular lymphoma, unspecified, lymph nodes of multiple sites: Secondary | ICD-10-CM

## 2011-02-14 DIAGNOSIS — C8200 Follicular lymphoma grade I, unspecified site: Secondary | ICD-10-CM

## 2011-02-14 DIAGNOSIS — R7309 Other abnormal glucose: Secondary | ICD-10-CM

## 2011-02-14 DIAGNOSIS — Z5112 Encounter for antineoplastic immunotherapy: Secondary | ICD-10-CM

## 2011-02-14 LAB — CBC WITH DIFFERENTIAL/PLATELET
BASO%: 0.7 % (ref 0.0–2.0)
EOS%: 2 % (ref 0.0–7.0)
HCT: 39.5 % (ref 34.8–46.6)
LYMPH%: 23.4 % (ref 14.0–49.7)
MCH: 32.2 pg (ref 25.1–34.0)
MCHC: 33.9 g/dL (ref 31.5–36.0)
NEUT%: 65.5 % (ref 38.4–76.8)
Platelets: 222 10*3/uL (ref 145–400)

## 2011-02-14 LAB — COMPREHENSIVE METABOLIC PANEL
AST: 21 U/L (ref 0–37)
Alkaline Phosphatase: 92 U/L (ref 39–117)
BUN: 21 mg/dL (ref 6–23)
Calcium: 9.8 mg/dL (ref 8.4–10.5)
Creatinine, Ser: 0.71 mg/dL (ref 0.50–1.10)

## 2011-02-14 LAB — URIC ACID: Uric Acid, Serum: 3.6 mg/dL (ref 2.4–7.0)

## 2011-02-14 LAB — TSH: TSH: 2.319 u[IU]/mL (ref 0.350–4.500)

## 2011-02-15 ENCOUNTER — Encounter: Payer: Self-pay | Admitting: *Deleted

## 2011-02-23 ENCOUNTER — Encounter: Payer: Self-pay | Admitting: *Deleted

## 2011-02-25 ENCOUNTER — Encounter: Payer: Self-pay | Admitting: Oncology

## 2011-02-25 DIAGNOSIS — C8200 Follicular lymphoma grade I, unspecified site: Secondary | ICD-10-CM | POA: Insufficient documentation

## 2011-06-03 ENCOUNTER — Encounter: Payer: Self-pay | Admitting: *Deleted

## 2011-06-16 ENCOUNTER — Ambulatory Visit (HOSPITAL_COMMUNITY)
Admission: RE | Admit: 2011-06-16 | Discharge: 2011-06-16 | Disposition: A | Discharge status: Home / Self Care | Payer: BC Managed Care – PPO | Source: Ambulatory Visit | Attending: Oncology | Admitting: Oncology

## 2011-06-16 ENCOUNTER — Encounter (HOSPITAL_COMMUNITY): Payer: Self-pay

## 2011-06-16 DIAGNOSIS — E0789 Other specified disorders of thyroid: Secondary | ICD-10-CM | POA: Insufficient documentation

## 2011-06-16 DIAGNOSIS — Z8572 Personal history of non-Hodgkin lymphomas: Secondary | ICD-10-CM

## 2011-06-16 DIAGNOSIS — Z87898 Personal history of other specified conditions: Secondary | ICD-10-CM | POA: Insufficient documentation

## 2011-06-16 DIAGNOSIS — K7689 Other specified diseases of liver: Secondary | ICD-10-CM | POA: Insufficient documentation

## 2011-06-16 MED ORDER — IOHEXOL 300 MG/ML  SOLN
100.0000 mL | Freq: Once | INTRAMUSCULAR | Status: AC | PRN
  Administered 2011-06-16: 100 mL via INTRAVENOUS

## 2011-06-17 ENCOUNTER — Other Ambulatory Visit: Payer: Self-pay | Admitting: *Deleted

## 2011-06-17 DIAGNOSIS — C8590 Non-Hodgkin lymphoma, unspecified, unspecified site: Secondary | ICD-10-CM

## 2011-06-20 ENCOUNTER — Other Ambulatory Visit: Payer: BC Managed Care – PPO | Admitting: Lab

## 2011-06-20 ENCOUNTER — Other Ambulatory Visit: Payer: Self-pay | Admitting: *Deleted

## 2011-06-20 ENCOUNTER — Telehealth: Payer: Self-pay | Admitting: Oncology

## 2011-06-20 ENCOUNTER — Encounter: Payer: Self-pay | Admitting: *Deleted

## 2011-06-20 ENCOUNTER — Ambulatory Visit (HOSPITAL_BASED_OUTPATIENT_CLINIC_OR_DEPARTMENT_OTHER): Payer: BC Managed Care – PPO | Admitting: Oncology

## 2011-06-20 VITALS — BP 125/75 | HR 68 | Temp 97.4°F | Ht 65.5 in | Wt 162.6 lb

## 2011-06-20 DIAGNOSIS — C8200 Follicular lymphoma grade I, unspecified site: Secondary | ICD-10-CM

## 2011-06-20 DIAGNOSIS — C8590 Non-Hodgkin lymphoma, unspecified, unspecified site: Secondary | ICD-10-CM

## 2011-06-20 DIAGNOSIS — C8589 Other specified types of non-Hodgkin lymphoma, extranodal and solid organ sites: Secondary | ICD-10-CM

## 2011-06-20 LAB — CBC WITH DIFFERENTIAL/PLATELET
Basophils Absolute: 0 10*3/uL (ref 0.0–0.1)
EOS%: 2.1 % (ref 0.0–7.0)
HGB: 13.1 g/dL (ref 11.6–15.9)
MCH: 30.9 pg (ref 25.1–34.0)
NEUT#: 3.9 10*3/uL (ref 1.5–6.5)
RDW: 12.2 % (ref 11.2–14.5)
lymph#: 1.3 10*3/uL (ref 0.9–3.3)

## 2011-06-20 LAB — COMPREHENSIVE METABOLIC PANEL
ALT: 20 U/L (ref 0–35)
AST: 21 U/L (ref 0–37)
Albumin: 4.5 g/dL (ref 3.5–5.2)
BUN: 20 mg/dL (ref 6–23)
Calcium: 9.7 mg/dL (ref 8.4–10.5)
Chloride: 104 mEq/L (ref 96–112)
Potassium: 3.8 mEq/L (ref 3.5–5.3)
Sodium: 140 mEq/L (ref 135–145)
Total Protein: 6.7 g/dL (ref 6.0–8.3)

## 2011-06-20 LAB — PHOSPHORUS: Phosphorus: 3.5 mg/dL (ref 2.3–4.6)

## 2011-06-20 NOTE — Telephone Encounter (Signed)
Gv pt appts for [id number] °

## 2011-06-20 NOTE — Progress Notes (Signed)
06/20/2011 @ 3:00pm, [facility] 11914, Four Months Post-Treatment Visit:  [name] into the [facility] for labs and to see Dr. Gaylyn Rong for her follow-up assessment.  She had CT scans of chest, abdomen and pelvis done last week.  She has no evidence of disease.  Spoke with Debra Schwartz in the lobby and she is reportedly doing well with no complaints.  All orders for next 4 month assessment have been entered.  Dr. Gaylyn Rong notified.   Provided Debra Schwartz with a copy of the protocol schedule of assessments for the Follow-Up period.

## 2011-06-20 NOTE — Progress Notes (Signed)
[facility] OFFICE PROGRESS NOTE  Cc:  MCNEILL,WENDY, MD, MD  DIAGNOSIS: history of grade 2 follicular lymphoma, stage III.  Her IPI score is 2 given stage III and more than 4 lymph node involvement (albeit low tumor burden) which is intermediate risk.  PAST THERAPY:  clinical trial [facility] 16109 with Rituxan/Revlimid between 02/2010 and 01/2011.   CURRENT THERAPY:  watchful observation.  INTERVAL HISTORY: Debra Schwartz 56 y.o. female returns for regular follow up with her husband.  She denies any palpable adenopathy.  She works full time without fatigue.  Patient denies headache, visual changes, confusion, drenching night sweats, mucositis, odynophagia, dysphagia, nausea vomiting, jaundice, chest pain, palpitation, shortness of breath, dyspnea on exertion, productive cough, gum bleeding, epistaxis, hematemesis, hemoptysis, abdominal pain, abdominal swelling, early satiety, melena, hematochezia, hematuria, skin rash, spontaneous bleeding, joint swelling, joint pain, heat or cold intolerance, bowel bladder incontinence, back pain, focal motor weakness, paresthesia, depression, suicidal or homocidal ideation, feeling hopelessness.   MEDICAL HISTORY: Past Medical History  Diagnosis Date  . Follicular lymphoma grade I   . Hyperthyroidism   . Thyroiditis   . nhl dx'd 2011    SURGICAL HISTORY: No past surgical history on file.  MEDICATIONS: Current Outpatient Prescriptions  Medication Sig Dispense Refill  . cholecalciferol (VITAMIN D) 1000 UNITS tablet Take 1,000 Units by mouth daily.      Marland Kitchen estrogens, conjugated, (PREMARIN) 0.45 MG tablet Take 0.45 mg by mouth daily.        Marland Kitchen Fexofenadine-Pseudoephedrine (ALLEGRA-D PO) Take 1 tablet by mouth 1 day or 1 dose.        . fish oil-omega-3 fatty acids 1000 MG capsule Take 2 g by mouth 2 (two) times daily.        . fluticasone (VERAMYST) 27.5 MCG/SPRAY nasal spray Place 1 spray into the nose daily. Each nostril daily      .  Olopatadine HCl (PATANASE) 0.6 % SOLN Place 2 drops into the nose 1 day or 1 dose.        . Plant Sterols and Stanols (CHOLEST OFF PO) Take 900 mg by mouth 2 (two) times daily.       . progesterone (PROMETRIUM) 200 MG capsule Take 200 mg by mouth. Take once every 3 months for 12 days        ALLERGIES:   has no known allergies.  REVIEW OF SYSTEMS:  The rest of the 14-point review of system was negative.   Filed Vitals:   06/20/11 1459  BP: 125/75  Pulse: 68  Temp: 97.4 F (36.3 C)   Wt Readings from Last 3 Encounters:  06/20/11 162 lb 9.6 oz (73.755 kg)  02/14/11 159 lb 1.6 oz (72.167 kg)   ECOG Performance status: 0  PHYSICAL EXAMINATION:   General:  well-nourished in no acute distress.  Eyes:  no scleral icterus.  ENT:  There were no oropharyngeal lesions.  Neck was without thyromegaly.  Lymphatics:  Negative cervical, supraclavicular or axillary adenopathy.  Respiratory: lungs were clear bilaterally without wheezing or crackles.  Cardiovascular:  Regular rate and rhythm, S1/S2, without murmur, rub or gallop.  There was no pedal edema.  GI:  abdomen was soft, flat, nontender, nondistended, without organomegaly.  Muscoloskeletal:  no spinal tenderness of palpation of vertebral spine.  Skin exam was without echymosis, petichae.  Neuro exam was nonfocal.  Patient was able to get on and off exam table without assistance.  Gait was normal.  Patient was alerted and oriented.  Attention was  good.   Language was appropriate.  Mood was normal without depression.  Speech was not pressured.  Thought content was not tangential.     LABORATORY/RADIOLOGY DATA:  Lab Results  Component Value Date   WBC 5.9 06/20/2011   HGB 13.1 06/20/2011   HCT 38.7 06/20/2011   PLT 228 06/20/2011   GLUCOSE 90 02/14/2011   GLUCOSE 90 02/14/2011   ALT 21 02/14/2011   ALT 21 02/14/2011   AST 21 02/14/2011   AST 21 02/14/2011   NA 142 02/14/2011   NA 142 02/14/2011   K 3.9 02/14/2011   K 3.9 02/14/2011   CL 106 02/14/2011    CL 106 02/14/2011   CREATININE 0.71 02/14/2011   CREATININE 0.71 02/14/2011   BUN 21 02/14/2011   BUN 21 02/14/2011   CO2 26 02/14/2011   CO2 26 02/14/2011   TSH 2.319 02/14/2011   INR 0.88 06/19/2010    IMAGING:  I personally reviewed the following CT chest/abdomen/pelvis and showed the images to the patient and her husband.  There was no evidence of recurrent lymphoma.   Ct Chest W Contrast  06/16/2011  *RADIOLOGY REPORT*  Clinical Data:  History of lymphoma.  CT CHEST, ABDOMEN AND PELVIS WITH CONTRAST  Technique:  Multidetector CT imaging of the chest, abdomen and pelvis was performed following the standard protocol during bolus administration of intravenous contrast.  Contrast: OMNIPAQUE IOHEXOL 300 MG/ML IV SOLN  Comparison:  Multiple priors, most recently CT of chest abdomen and pelvis dated 02/10/2011.  CT CHEST  Findings:  Mediastinum:  Heart size is normal.  No pericardial fluid, thickening or calcification.  No acute abnormality of the thoracic aorta or other great vessels of the mediastinum.  No filling defects are noted within the central pulmonary arterial tree to suggest large pulmonary emboli.  No pathologically enlarged mediastinal, internal mammary or hilar lymph nodes.  The esophagus is normal in appearance.  11 x 6 mm low attenuation lesion in the posterior aspect of the right lobe of the thyroid gland is very similar to prior study dated 02/10/2011.  A new 5 mm low attenuation lesion is noted in the posterior aspect of the left lobe of the thyroid gland.  Lungs/Pleura:  No consolidative airspace disease.  No pleural effusions.  No suspicious appearing pulmonary nodules or masses.  Musculoskeletal:  No aggressive appearing lytic or blastic lesions are noted in the visualized portions of the skeleton. No pathologically enlarged axillary lymph nodes.  IMPRESSION:  1. No evidence of disease recurrence within the thorax. 2. Nonspecific low attenuation thyroid lesions, as above.  CT findings  and thyroid disease are highly nonspecific.  If there is clinical concern for thyroid disease or neoplasm, further evaluation with dedicated thyroid ultrasound may be warranted.  CT ABDOMEN AND PELVIS  Findings:  Abdomen/Pelvis: Small perfusion anomaly in segment 4B of the liver adjacent the falciform ligament (unchanged), a benign finding.  A 6 mm low attenuation lesion in segment six of the liver is too small to characterize, but unchanged compared to the prior study, and strongly favored to represent a benign lesion such as a cyst.  The infused appearance of the gallbladder, pancreas, spleen, bilateral adrenal glands and bilateral kidneys is unremarkable. Small splenule adjacent to the splenic hilum.  No ascites or pneumoperitoneum.  No pathologic distention of bowel.  No abnormal soft tissue masses or pathologically enlarged lymph nodes noted. Normal appendix.  Uterus and ovaries are unremarkable in appearance.  Musculoskeletal:  No aggressive appearing  lytic or blastic lesions noted in the visualized portions of the skeleton.  IMPRESSION:  1.  No findings to suggest disease recurrence within the abdomen or pelvis. 2.  6 mm low attenuation lesion in segment six of the liver is too small to characterize, but given that it is unchanged compared to the prior examination, this is strongly favored to represent a benign lesion such as a small cyst.  Original Report Authenticated By: Florencia Reasons, M.D.   Ct Abdomen Pelvis W Contrast  06/16/2011  *RADIOLOGY REPORT*  Clinical Data:  History of lymphoma.  CT CHEST, ABDOMEN AND PELVIS WITH CONTRAST  Technique:  Multidetector CT imaging of the chest, abdomen and pelvis was performed following the standard protocol during bolus administration of intravenous contrast.  Contrast: OMNIPAQUE IOHEXOL 300 MG/ML IV SOLN  Comparison:  Multiple priors, most recently CT of chest abdomen and pelvis dated 02/10/2011.  CT CHEST  Findings:  Mediastinum:  Heart size is  normal.  No pericardial fluid, thickening or calcification.  No acute abnormality of the thoracic aorta or other great vessels of the mediastinum.  No filling defects are noted within the central pulmonary arterial tree to suggest large pulmonary emboli.  No pathologically enlarged mediastinal, internal mammary or hilar lymph nodes.  The esophagus is normal in appearance.  11 x 6 mm low attenuation lesion in the posterior aspect of the right lobe of the thyroid gland is very similar to prior study dated 02/10/2011.  A new 5 mm low attenuation lesion is noted in the posterior aspect of the left lobe of the thyroid gland.  Lungs/Pleura:  No consolidative airspace disease.  No pleural effusions.  No suspicious appearing pulmonary nodules or masses.  Musculoskeletal:  No aggressive appearing lytic or blastic lesions are noted in the visualized portions of the skeleton. No pathologically enlarged axillary lymph nodes.  IMPRESSION:  1. No evidence of disease recurrence within the thorax. 2. Nonspecific low attenuation thyroid lesions, as above.  CT findings and thyroid disease are highly nonspecific.  If there is clinical concern for thyroid disease or neoplasm, further evaluation with dedicated thyroid ultrasound may be warranted.  CT ABDOMEN AND PELVIS  Findings:  Abdomen/Pelvis: Small perfusion anomaly in segment 4B of the liver adjacent the falciform ligament (unchanged), a benign finding.  A 6 mm low attenuation lesion in segment six of the liver is too small to characterize, but unchanged compared to the prior study, and strongly favored to represent a benign lesion such as a cyst.  The infused appearance of the gallbladder, pancreas, spleen, bilateral adrenal glands and bilateral kidneys is unremarkable. Small splenule adjacent to the splenic hilum.  No ascites or pneumoperitoneum.  No pathologic distention of bowel.  No abnormal soft tissue masses or pathologically enlarged lymph nodes noted. Normal appendix.   Uterus and ovaries are unremarkable in appearance.  Musculoskeletal:  No aggressive appearing lytic or blastic lesions noted in the visualized portions of the skeleton.  IMPRESSION:  1.  No findings to suggest disease recurrence within the abdomen or pelvis. 2.  6 mm low attenuation lesion in segment six of the liver is too small to characterize, but given that it is unchanged compared to the prior examination, this is strongly favored to represent a benign lesion such as a small cyst.  Original Report Authenticated By: Florencia Reasons, M.D.   ASSESSMENT AND PLAN:   1. History of lymphoma:  I discussed with Ms. Mclester and her husband that she is still in  remission.  Per clinical history, exam, lab, and imaging, there is no sign of recurrence.  I again recommended observation.  She has appointment to have a repeat CT in about 4 months and return to clinic as part of past participation in clinical trial.  After 01/2013 (2 years post therapy), if she is still in remission, I may consider spacing out restaging surveillance CT.  2. History of thyroiditis:  resolved; has not recurred.  She follows with Dr. Sharl Ma.

## 2011-10-17 ENCOUNTER — Other Ambulatory Visit (HOSPITAL_COMMUNITY): Payer: Self-pay

## 2011-10-17 ENCOUNTER — Other Ambulatory Visit: Payer: Self-pay | Admitting: Family Medicine

## 2011-10-17 ENCOUNTER — Other Ambulatory Visit: Payer: Self-pay | Admitting: Lab

## 2011-10-17 DIAGNOSIS — E042 Nontoxic multinodular goiter: Secondary | ICD-10-CM

## 2011-10-19 ENCOUNTER — Ambulatory Visit: Payer: Self-pay | Admitting: Oncology

## 2011-10-26 ENCOUNTER — Other Ambulatory Visit (HOSPITAL_BASED_OUTPATIENT_CLINIC_OR_DEPARTMENT_OTHER): Payer: BC Managed Care – PPO

## 2011-10-26 ENCOUNTER — Ambulatory Visit (HOSPITAL_COMMUNITY)
Admission: RE | Admit: 2011-10-26 | Discharge: 2011-10-26 | Disposition: A | Discharge status: Home / Self Care | Payer: BC Managed Care – PPO | Source: Ambulatory Visit | Attending: Oncology | Admitting: Oncology

## 2011-10-26 DIAGNOSIS — N289 Disorder of kidney and ureter, unspecified: Secondary | ICD-10-CM | POA: Insufficient documentation

## 2011-10-26 DIAGNOSIS — E079 Disorder of thyroid, unspecified: Secondary | ICD-10-CM | POA: Insufficient documentation

## 2011-10-26 DIAGNOSIS — C8589 Other specified types of non-Hodgkin lymphoma, extranodal and solid organ sites: Secondary | ICD-10-CM

## 2011-10-26 DIAGNOSIS — C8590 Non-Hodgkin lymphoma, unspecified, unspecified site: Secondary | ICD-10-CM

## 2011-10-26 DIAGNOSIS — K7689 Other specified diseases of liver: Secondary | ICD-10-CM | POA: Insufficient documentation

## 2011-10-26 DIAGNOSIS — Z9221 Personal history of antineoplastic chemotherapy: Secondary | ICD-10-CM | POA: Insufficient documentation

## 2011-10-26 LAB — CBC WITH DIFFERENTIAL/PLATELET
Basophils Absolute: 0.1 10*3/uL (ref 0.0–0.1)
Eosinophils Absolute: 0.2 10*3/uL (ref 0.0–0.5)
MCH: 30.6 pg (ref 25.1–34.0)
MCHC: 33.4 g/dL (ref 31.5–36.0)
NEUT%: 52 % (ref 38.4–76.8)
Platelets: 211 10*3/uL (ref 145–400)
RDW: 12.9 % (ref 11.2–14.5)
WBC: 4.8 10*3/uL (ref 3.9–10.3)
lymph#: 1.6 10*3/uL (ref 0.9–3.3)

## 2011-10-26 LAB — CMP (CANCER CENTER ONLY)
AST: 21 U/L (ref 11–38)
Alkaline Phosphatase: 85 U/L — ABNORMAL HIGH (ref 26–84)
BUN, Bld: 18 mg/dL (ref 7–22)
Creat: 0.9 mg/dl (ref 0.6–1.2)
Potassium: 4.7 mEq/L (ref 3.3–4.7)

## 2011-10-26 LAB — TSH: TSH: 2.441 u[IU]/mL (ref 0.350–4.500)

## 2011-10-26 LAB — URIC ACID: Uric Acid, Serum: 5.5 mg/dL (ref 2.4–7.0)

## 2011-10-26 LAB — LACTATE DEHYDROGENASE: LDH: 151 U/L (ref 94–250)

## 2011-10-26 MED ORDER — IOHEXOL 300 MG/ML  SOLN
100.0000 mL | Freq: Once | INTRAMUSCULAR | Status: AC | PRN
  Administered 2011-10-26: 100 mL via INTRAVENOUS

## 2011-10-28 ENCOUNTER — Ambulatory Visit (HOSPITAL_BASED_OUTPATIENT_CLINIC_OR_DEPARTMENT_OTHER): Payer: BC Managed Care – PPO | Admitting: Oncology

## 2011-10-28 ENCOUNTER — Encounter: Payer: Self-pay | Admitting: *Deleted

## 2011-10-28 VITALS — BP 151/90 | HR 69 | Temp 97.2°F | Ht 65.5 in | Wt 166.0 lb

## 2011-10-28 DIAGNOSIS — C8299 Follicular lymphoma, unspecified, extranodal and solid organ sites: Secondary | ICD-10-CM

## 2011-10-28 DIAGNOSIS — C8200 Follicular lymphoma grade I, unspecified site: Secondary | ICD-10-CM

## 2011-10-28 NOTE — Progress Notes (Signed)
[facility]  Telephone:(336) [phone] Fax:([phone]   OFFICE PROGRESS NOTE   Cc:  MCNEILL,WENDY, MD  DIAGNOSIS:  history of grade 2 follicular lymphoma, stage III. Her IPI score is 2 given stage III and more than 4 lymph node involvement (albeit low tumor burden) which is intermediate risk.   PAST THERAPY: clinical trial CALGB 78469 with Rituxan/Revlimid between 02/2010 and 01/2011.   CURRENT THERAPY: watchful observation.  INTERVAL HISTORY: THEO REITHER 56 y.o. female returns for regular follow up with her husband.  She reports feeling well without palpitation, chest pain, SOB, dyspnea on exertion, palpable node swelling, anorexia, weight loss, night sweat, fatigue, neck swelling, dysphagia, odynophagia.   Patient denies headache, visual changes, confusion, mucositis, nausea vomiting, jaundice, productive cough, gum bleeding, epistaxis, hematemesis, hemoptysis, abdominal pain, abdominal swelling, early satiety, melena, hematochezia, hematuria, skin rash, spontaneous bleeding, joint swelling, joint pain, heat or cold intolerance, bowel bladder incontinence, back pain, focal motor weakness, paresthesia, depression, suicidal or homocidal ideation, feeling hopelessness.   Past Medical History  Diagnosis Date  . Follicular lymphoma grade I   . Hyperthyroidism   . Thyroiditis   . nhl dx'd 2011    No past surgical history on file.  Current Outpatient Prescriptions  Medication Sig Dispense Refill  . cetirizine (ZYRTEC) 10 MG tablet Take 10 mg by mouth daily.      . Cholecalciferol (VITAMIN D) 2000 UNITS tablet Take 2,000 Units by mouth daily.      Marland Kitchen estrogens, conjugated, (PREMARIN) 0.45 MG tablet Take 0.45 mg by mouth daily.        . fexofenadine (ALLEGRA) 180 MG tablet Take 180 mg by mouth daily.      . fish oil-omega-3 fatty acids 1000 MG capsule Take 2 g by mouth 2 (two) times daily.        . Plant Sterols and Stanols (CHOLEST OFF PO) Take 900 mg by mouth 2  (two) times daily.       . progesterone (PROMETRIUM) 200 MG capsule Take 200 mg by mouth. Take once every 3 months for 12 days      . fluticasone (VERAMYST) 27.5 MCG/SPRAY nasal spray Place 1 spray into the nose daily. Each nostril daily      . Olopatadine HCl (PATANASE) 0.6 % SOLN Place 2 drops into the nose 1 day or 1 dose.          ALLERGIES:   has no known allergies.  REVIEW OF SYSTEMS:  The rest of the 14-point review of system was negative.   Filed Vitals:   10/28/11 1333  BP: 151/90  Pulse: 69  Temp: 97.2 F (36.2 C)   Wt Readings from Last 3 Encounters:  10/28/11 166 lb (75.297 kg)  06/20/11 162 lb 9.6 oz (73.755 kg)  02/14/11 159 lb 1.6 oz (72.167 kg)   ECOG Performance status: 0  PHYSICAL EXAMINATION:   General:  well-nourished woman in no acute distress.  Eyes:  no scleral icterus.  ENT:  There were no oropharyngeal lesions.  Neck was without thyromegaly.  Lymphatics:  Negative cervical, supraclavicular or axillary adenopathy.  Respiratory: lungs were clear bilaterally without wheezing or crackles.  Cardiovascular:  Regular rate and rhythm, S1/S2, without murmur, rub or gallop.  There was no pedal edema.  GI:  abdomen was soft, flat, nontender, nondistended, without organomegaly.  Muscoloskeletal:  no spinal tenderness of palpation of vertebral spine.  Skin exam was without echymosis, petichae.  Neuro exam was nonfocal.  Patient was able  to get on and off exam table without assistance.  Gait was normal.  Patient was alerted and oriented.  Attention was good.   Language was appropriate.  Mood was normal without depression.  Speech was not pressured.  Thought content was not tangential.         LABORATORY/RADIOLOGY DATA:  Lab Results  Component Value Date   WBC 4.8 10/26/2011   HGB 13.5 10/26/2011   HCT 40.4 10/26/2011   PLT 211 10/26/2011   GLUCOSE 101 10/26/2011   ALKPHOS 85* 10/26/2011   ALT 20 06/20/2011   AST 21 10/26/2011   NA 139 10/26/2011   K 4.7 10/26/2011   CL  102 10/26/2011   CREATININE 0.9 10/26/2011   BUN 18 10/26/2011   CO2 29 10/26/2011   INR 0.88 06/19/2010    IMAGING:  I personally reviewed the following CT and showed the patient and her husband the images.    Ct Chest W Contrast  10/26/2011  *RADIOLOGY REPORT*  Clinical Data:  History of non-Hodgkins lymphoma.  Restaging scan. Oral chemotherapy completed in September 2012.  CT CHEST, ABDOMEN AND PELVIS WITH CONTRAST  Technique:  Multidetector CT imaging of the chest, abdomen and pelvis was performed following the standard protocol during bolus administration of intravenous contrast.  Contrast: OMNIPAQUE IOHEXOL 300 MG/ML  SOLN  Comparison:  CT of chest, abdomen and pelvis 06/16/2011.  CT CHEST  Findings:  Mediastinum: Tiny low attenuation lesions in the posterior aspect of the thyroid gland bilaterally (right greater than left) are nonspecific but unchanged compared to prior examinations. Heart size is normal. There is no significant pericardial fluid, thickening or pericardial calcification. No pathologically enlarged mediastinal or hilar lymph nodes. The esophagus is unremarkable in appearance.  Lungs/Pleura: No consolidative airspace disease.  No pleural effusions.  No suspicious appearing pulmonary nodules or masses.  Musculoskeletal: There are no aggressive appearing lytic or blastic lesions noted in the visualized portions of the skeleton.  IMPRESSION:  1.  No findings to suggest a disease recurrence within the thorax. 2.  Unchanged subcentimeter low attenuation thyroid lesions remain nonspecific, but there is stability in size of the time would suggest a benign etiology.  CT ABDOMEN AND PELVIS  Findings:  Abdomen/Pelvis: There is a low-attenuation the adjacent the falciform ligament which is unchanged compared to the prior study, and consistent with a perfusion anomaly.  6 mm low attenuation lesion in segment 6 of the liver is unchanged, and they are too small to definitively characterize is most  consistent with a small cyst.  The enhanced appearance of the gallbladder, pancreas, spleen, bilateral adrenal glands and the left kidney is unremarkable.  There is a 1 cm low attenuation lesion in the interpolar region of the right kidney which is unchanged in retrospect compared to the prior study, and compatible with a simple cyst.  No ascites or pneumoperitoneum and no pathologic distension of bowel.  No definite pathologic lymphadenopathy identified within the abdomen or pelvis.  Uterus and ovaries are unremarkable in appearance bilaterally.  Urinary bladder is normal in appearance.  Musculoskeletal: There are no aggressive appearing lytic or blastic lesions noted in the visualized portions of the skeleton.  IMPRESSION:  1.  No findings to suggest disease recurrence within the abdomen or pelvis. 2.  Incidental findings, as above, unchanged compared to prior study from 06/16/2011.  Original Report Authenticated By: Florencia Reasons, M.D.   Ct Abdomen Pelvis W Contrast  10/26/2011  *RADIOLOGY REPORT*  Clinical Data:  History of  non-Hodgkins lymphoma.  Restaging scan. Oral chemotherapy completed in September 2012.  CT CHEST, ABDOMEN AND PELVIS WITH CONTRAST  Technique:  Multidetector CT imaging of the chest, abdomen and pelvis was performed following the standard protocol during bolus administration of intravenous contrast.  Contrast: OMNIPAQUE IOHEXOL 300 MG/ML  SOLN  Comparison:  CT of chest, abdomen and pelvis 06/16/2011.  CT CHEST  Findings:  Mediastinum: Tiny low attenuation lesions in the posterior aspect of the thyroid gland bilaterally (right greater than left) are nonspecific but unchanged compared to prior examinations. Heart size is normal. There is no significant pericardial fluid, thickening or pericardial calcification. No pathologically enlarged mediastinal or hilar lymph nodes. The esophagus is unremarkable in appearance.  Lungs/Pleura: No consolidative airspace disease.  No pleural  effusions.  No suspicious appearing pulmonary nodules or masses.  Musculoskeletal: There are no aggressive appearing lytic or blastic lesions noted in the visualized portions of the skeleton.  IMPRESSION:  1.  No findings to suggest a disease recurrence within the thorax. 2.  Unchanged subcentimeter low attenuation thyroid lesions remain nonspecific, but there is stability in size of the time would suggest a benign etiology.  CT ABDOMEN AND PELVIS  Findings:  Abdomen/Pelvis: There is a low-attenuation the adjacent the falciform ligament which is unchanged compared to the prior study, and consistent with a perfusion anomaly.  6 mm low attenuation lesion in segment 6 of the liver is unchanged, and they are too small to definitively characterize is most consistent with a small cyst.  The enhanced appearance of the gallbladder, pancreas, spleen, bilateral adrenal glands and the left kidney is unremarkable.  There is a 1 cm low attenuation lesion in the interpolar region of the right kidney which is unchanged in retrospect compared to the prior study, and compatible with a simple cyst.  No ascites or pneumoperitoneum and no pathologic distension of bowel.  No definite pathologic lymphadenopathy identified within the abdomen or pelvis.  Uterus and ovaries are unremarkable in appearance bilaterally.  Urinary bladder is normal in appearance.  Musculoskeletal: There are no aggressive appearing lytic or blastic lesions noted in the visualized portions of the skeleton.  IMPRESSION:  1.  No findings to suggest disease recurrence within the abdomen or pelvis. 2.  Incidental findings, as above, unchanged compared to prior study from 06/16/2011.  Original Report Authenticated By: Florencia Reasons, M.D.    ASSESSMENT AND PLAN:    1. History of lymphoma: I discussed with Ms. Meals and her husband that she is still in remission. Per clinical history, exam, lab, and imaging, there is no sign of recurrence. I again recommended  observation. She has appointment to have a repeat CT in about 4 months and return to clinic as part of past participation in clinical trial. She is concerned about the frequency of CT scan every 3 months and potential increased in risk of secondary cancer.  She requested that we gets CT scan every 6 months instead.  We will inform the clinical trial office of her preference.  Since she had palpable adenopathy at presentation, I agreed with her request.  Thus, if she has palpable adenopathy in between 6 month surveillance CT scan, we can get it sooner.  2. History of thyroiditis: resolved; has not recurred. She follows with Dr. Sharl Ma.  3. Age-appropriate cancer screening:  She claims to be up to date with her colonoscopy, mammogram and Papsmear.  4. Follow up:  In about 4 months.    The length of time of  the face-to-face encounter was 15 minutes. More than 50% of time was spent counseling and coordination of care.

## 2011-10-28 NOTE — Patient Instructions (Addendum)
A.  Result of CT scan 10/26/11:    CT CHEST  Findings:  Mediastinum: Tiny low attenuation lesions in the posterior aspect  of the thyroid gland bilaterally (right greater than left) are  nonspecific but unchanged compared to prior examinations. Heart  size is normal. There is no significant pericardial fluid,  thickening or pericardial calcification. No pathologically enlarged  mediastinal or hilar lymph nodes. The esophagus is unremarkable in  appearance.  Lungs/Pleura: No consolidative airspace disease. No pleural  effusions. No suspicious appearing pulmonary nodules or masses.  Musculoskeletal: There are no aggressive appearing lytic or blastic  lesions noted in the visualized portions of the skeleton.   IMPRESSION:  1. No findings to suggest a disease recurrence within the thorax.  2. Unchanged subcentimeter low attenuation thyroid lesions remain  nonspecific, but there is stability in size of the time would  suggest a benign etiology.   CT ABDOMEN AND PELVIS   Findings:  Abdomen/Pelvis: There is a low-attenuation the adjacent the  falciform ligament which is unchanged compared to the prior study,  and consistent with a perfusion anomaly. 6 mm low attenuation  lesion in segment 6 of the liver is unchanged, and they are too  small to definitively characterize is most consistent with a small  cyst. The enhanced appearance of the gallbladder, pancreas,  spleen, bilateral adrenal glands and the left kidney is  unremarkable. There is a 1 cm low attenuation lesion in the  interpolar region of the right kidney which is unchanged in  retrospect compared to the prior study, and compatible with a  simple cyst. No ascites or pneumoperitoneum and no pathologic  distension of bowel. No definite pathologic lymphadenopathy  identified within the abdomen or pelvis. Uterus and ovaries are  unremarkable in appearance bilaterally. Urinary bladder is normal  in appearance.  Musculoskeletal:  There are no aggressive appearing lytic or blastic  lesions noted in the visualized portions of the skeleton.   IMPRESSION:  1. No findings to suggest disease recurrence within the abdomen or  pelvis.  2. Incidental findings, as above, unchanged compared to prior  study from 06/16/2011.  B.  Follow up:  In about 4 months.  Per protocol, it asked for repeat CT in about 4 months.  However, if you want to space it out to 6 months to minimize radiation exposure, we can try to talk to the protocol folks.

## 2011-10-28 NOTE — Progress Notes (Signed)
10/28/11 @1400 , CALGB [address], Eight Months Post-Treatment:  Debra Schwartz in to the [facility] to see Dr. Gaylyn Rong.   She completed protocol lab assessments and CTs of chest, abd and pelvis on October 26, 2011.  There is no evidence of disease recurrence.   Dr. Gaylyn Rong called to say Debra Schwartz does not want to have CT scans every 4 months as directed by the study, but will agree to every six months.  Will contact the study chair to see whether she can remain on study in follow-up with an adjusted schedule for re-staging scans.  Study chair responded that we should continue to report all data on patient if she is willing to stay on study, despite her refusal to maintain the imaging schedule.

## 2011-11-01 ENCOUNTER — Ambulatory Visit
Admission: RE | Admit: 2011-11-01 | Discharge: 2011-11-01 | Disposition: A | Discharge status: Home / Self Care | Payer: BC Managed Care – PPO | Source: Ambulatory Visit | Attending: Family Medicine | Admitting: Family Medicine

## 2011-11-01 DIAGNOSIS — E042 Nontoxic multinodular goiter: Secondary | ICD-10-CM

## 2011-11-08 ENCOUNTER — Other Ambulatory Visit: Payer: Self-pay | Admitting: *Deleted

## 2011-11-08 DIAGNOSIS — C8590 Non-Hodgkin lymphoma, unspecified, unspecified site: Secondary | ICD-10-CM

## 2011-11-14 NOTE — Progress Notes (Signed)
Received office notes from Dr. Talmage Coin @ [facility]; forwarded to Dr. Gaylyn Rong.

## 2011-11-24 ENCOUNTER — Ambulatory Visit: Payer: BC Managed Care – PPO | Attending: Family Medicine | Admitting: Physical Therapy

## 2011-11-24 DIAGNOSIS — M25569 Pain in unspecified knee: Secondary | ICD-10-CM | POA: Insufficient documentation

## 2011-11-24 DIAGNOSIS — IMO0001 Reserved for inherently not codable concepts without codable children: Secondary | ICD-10-CM | POA: Insufficient documentation

## 2011-12-07 ENCOUNTER — Ambulatory Visit: Payer: BC Managed Care – PPO

## 2011-12-13 ENCOUNTER — Ambulatory Visit: Payer: BC Managed Care – PPO | Admitting: Physical Therapy

## 2011-12-16 ENCOUNTER — Encounter: Payer: Self-pay | Admitting: Physical Therapy

## 2011-12-20 ENCOUNTER — Ambulatory Visit: Payer: BC Managed Care – PPO | Attending: Family Medicine | Admitting: Physical Therapy

## 2011-12-20 DIAGNOSIS — M25569 Pain in unspecified knee: Secondary | ICD-10-CM | POA: Insufficient documentation

## 2011-12-20 DIAGNOSIS — IMO0001 Reserved for inherently not codable concepts without codable children: Secondary | ICD-10-CM | POA: Insufficient documentation

## 2012-02-19 IMAGING — NM NM THYROID IMAGING W/ UPTAKE SINGLE (24 HR)
1 series · 1 of 1 positions shown · non-contrast
Comparison: None

CLINICAL DATA: Hyperthyroidism.  Decreased TSH.

THYROID  UPTAKE - 24 HOURS
TECHNIQUE: Following the per oral administration of B-ZQZ Sodium
Iodide, the patient returned at 24 hours and uptake measurements
were acquired with the uptake probe centered on the neck.
Radiopharmaceuticals: AUu2i B-ZQZ Sodium Iodide.

[st static image · 1 of 1 slices shown]
[im 1/1]
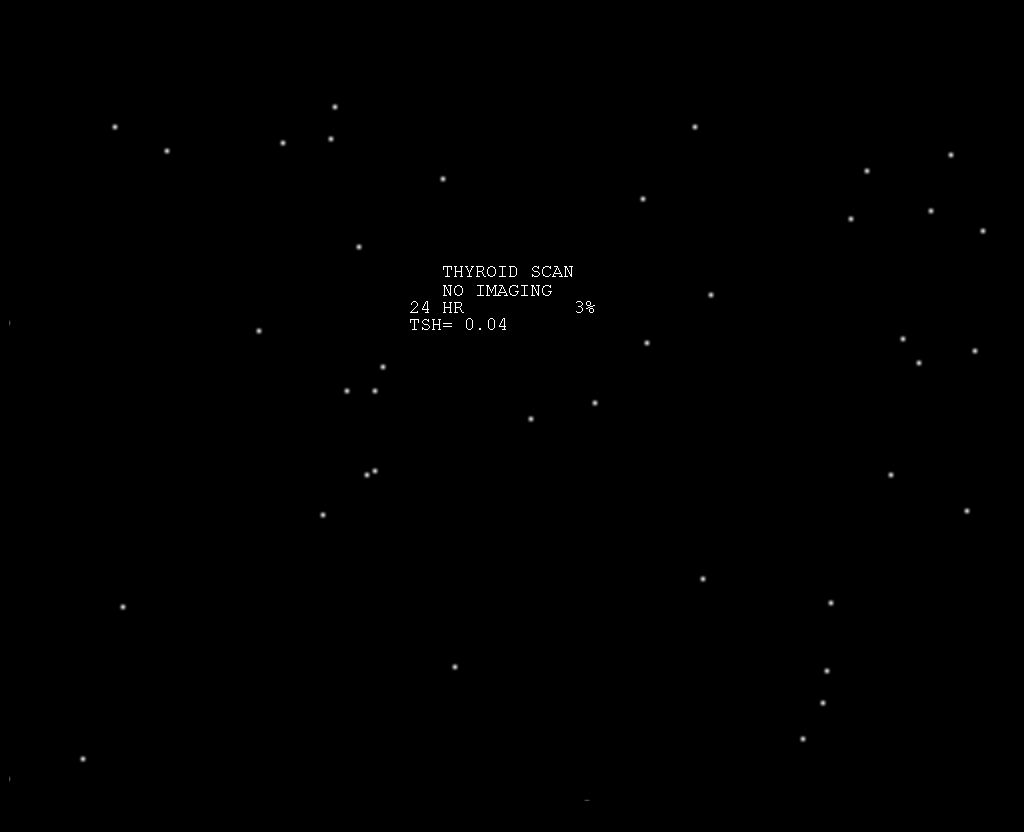

[1 of 1 positions shown; findings below may reference images not displayed]

FINDINGS: The 24 hour radioactive iodine uptake is equal to 0.3%.

No imaging obtained.
IMPRESSION: 1.  Markedly decreased radioactive iodine uptake at the 0.3%.  In
the setting of hyperthyroidism this finding is consistent with
subacute thyroiditis.

## 2012-02-29 ENCOUNTER — Other Ambulatory Visit (HOSPITAL_BASED_OUTPATIENT_CLINIC_OR_DEPARTMENT_OTHER): Payer: BC Managed Care – PPO | Admitting: Lab

## 2012-02-29 ENCOUNTER — Telehealth: Payer: Self-pay | Admitting: Oncology

## 2012-02-29 ENCOUNTER — Ambulatory Visit: Payer: Self-pay | Admitting: Oncology

## 2012-02-29 ENCOUNTER — Ambulatory Visit (HOSPITAL_BASED_OUTPATIENT_CLINIC_OR_DEPARTMENT_OTHER): Payer: BC Managed Care – PPO | Admitting: Oncology

## 2012-02-29 ENCOUNTER — Other Ambulatory Visit: Payer: Self-pay | Admitting: Lab

## 2012-02-29 ENCOUNTER — Encounter: Payer: Self-pay | Admitting: Oncology

## 2012-02-29 VITALS — BP 122/80 | HR 85 | Temp 97.9°F | Resp 20 | Ht 65.5 in | Wt 166.3 lb

## 2012-02-29 DIAGNOSIS — C8299 Follicular lymphoma, unspecified, extranodal and solid organ sites: Secondary | ICD-10-CM

## 2012-02-29 DIAGNOSIS — C859A Non-Hodgkin lymphoma, unspecified, in remission: Secondary | ICD-10-CM

## 2012-02-29 DIAGNOSIS — C8200 Follicular lymphoma grade I, unspecified site: Secondary | ICD-10-CM

## 2012-02-29 DIAGNOSIS — C8590 Non-Hodgkin lymphoma, unspecified, unspecified site: Secondary | ICD-10-CM

## 2012-02-29 LAB — COMPREHENSIVE METABOLIC PANEL (CC13)
ALT: 25 U/L (ref 0–55)
AST: 19 U/L (ref 5–34)
Albumin: 3.6 g/dL (ref 3.5–5.0)
Alkaline Phosphatase: 103 U/L (ref 40–150)
BUN: 16 mg/dL (ref 7.0–26.0)
Potassium: 4.1 mEq/L (ref 3.5–5.1)

## 2012-02-29 LAB — CBC WITH DIFFERENTIAL/PLATELET
BASO%: 1.1 % (ref 0.0–2.0)
EOS%: 1.7 % (ref 0.0–7.0)
HCT: 39.2 % (ref 34.8–46.6)
MCH: 30.6 pg (ref 25.1–34.0)
MCHC: 33.1 g/dL (ref 31.5–36.0)
MONO#: 0.4 10*3/uL (ref 0.1–0.9)
NEUT%: 64.5 % (ref 38.4–76.8)
RBC: 4.24 10*6/uL (ref 3.70–5.45)
RDW: 13 % (ref 11.2–14.5)
WBC: 6.8 10*3/uL (ref 3.9–10.3)
lymph#: 1.8 10*3/uL (ref 0.9–3.3)

## 2012-02-29 LAB — URIC ACID (CC13): Uric Acid, Serum: 4.7 mg/dl (ref 2.6–7.4)

## 2012-02-29 LAB — PHOSPHORUS: Phosphorus: 2.8 mg/dL (ref 2.3–4.6)

## 2012-02-29 NOTE — Telephone Encounter (Signed)
Printed and gv appt schedule to pt for Feb 2014

## 2012-03-01 ENCOUNTER — Other Ambulatory Visit: Payer: Self-pay | Admitting: *Deleted

## 2012-03-01 DIAGNOSIS — C8200 Follicular lymphoma grade I, unspecified site: Secondary | ICD-10-CM

## 2012-03-01 NOTE — Progress Notes (Signed)
[facility]  Telephone:(336) [phone] Fax:(336) [phone]   OFFICE PROGRESS NOTE   Cc:  MCNEILL,WENDY, MD  DIAGNOSIS:  history of grade 2 follicular lymphoma, stage III. Her IPI score is 2 given stage III and more than 4 lymph node involvement (albeit low tumor burden) which is intermediate risk.   PAST THERAPY: clinical trial CALGB 45409 with Rituxan/Revlimid between 02/2010 and 01/2011.   CURRENT THERAPY: watchful observation.  INTERVAL HISTORY: Debra Schwartz 56 y.o. female returns for regular follow up with by herself.  She reports feeling well without palpitation, chest pain, SOB, dyspnea on exertion, palpable node swelling, anorexia, weight loss, night sweat, fatigue, neck swelling, dysphagia, odynophagia. She continues to work full-time.   Patient denies headache, visual changes, confusion, mucositis, nausea vomiting, jaundice, productive cough, gum bleeding, epistaxis, hematemesis, hemoptysis, abdominal pain, abdominal swelling, early satiety, melena, hematochezia, hematuria, skin rash, spontaneous bleeding, joint swelling, joint pain, heat or cold intolerance, bowel bladder incontinence, back pain, focal motor weakness, paresthesia, depression, suicidal or homocidal ideation, feeling hopelessness.   Past Medical History  Diagnosis Date  . Follicular lymphoma grade I   . Hyperthyroidism   . Thyroiditis   . nhl dx'd 2011    History reviewed. No pertinent past surgical history.  Current Outpatient Prescriptions  Medication Sig Dispense Refill  . cetirizine (ZYRTEC) 10 MG tablet Take 10 mg by mouth daily.      . Cholecalciferol (VITAMIN D) 2000 UNITS tablet Take 2,000 Units by mouth daily.      Marland Kitchen estrogens, conjugated, (PREMARIN) 0.45 MG tablet Take 0.45 mg by mouth daily.        . fexofenadine (ALLEGRA) 180 MG tablet Take 180 mg by mouth daily.      . fish oil-omega-3 fatty acids 1000 MG capsule Take 2 g by mouth 2 (two) times daily.        . Plant Sterols  and Stanols (CHOLEST OFF PO) Take 900 mg by mouth 2 (two) times daily.       . progesterone (PROMETRIUM) 200 MG capsule Take 200 mg by mouth. Take once every 3 months for 12 days        ALLERGIES:   has no known allergies.  REVIEW OF SYSTEMS:  The rest of the 14-point review of system was negative.   Filed Vitals:   02/29/12 1507  BP: 122/80  Pulse: 85  Temp: 97.9 F (36.6 C)  Resp: 20   Wt Readings from Last 3 Encounters:  02/29/12 166 lb 4.8 oz (75.433 kg)  10/28/11 166 lb (75.297 kg)  06/20/11 162 lb 9.6 oz (73.755 kg)   ECOG Performance status: 0  PHYSICAL EXAMINATION:   General:  well-nourished woman in no acute distress.  Eyes:  no scleral icterus.  ENT:  There were no oropharyngeal lesions.  Neck was without thyromegaly.  Lymphatics:  Negative cervical, supraclavicular or axillary adenopathy.  Respiratory: lungs were clear bilaterally without wheezing or crackles.  Cardiovascular:  Regular rate and rhythm, S1/S2, without murmur, rub or gallop.  There was no pedal edema.  GI:  abdomen was soft, flat, nontender, nondistended, without organomegaly.  Muscoloskeletal:  no spinal tenderness of palpation of vertebral spine.  Skin exam was without echymosis, petichae.  Neuro exam was nonfocal.  Patient was able to get on and off exam table without assistance.  Gait was normal.  Patient was alerted and oriented.  Attention was good.   Language was appropriate.  Mood was normal without depression.  Speech was not  pressured.  Thought content was not tangential.     LABORATORY/RADIOLOGY DATA:  Lab Results  Component Value Date   WBC 6.8 02/29/2012   HGB 13.0 02/29/2012   HCT 39.2 02/29/2012   PLT 246 02/29/2012   GLUCOSE 106* 02/29/2012   ALKPHOS 103 02/29/2012   ALT 25 02/29/2012   AST 19 02/29/2012   NA 140 02/29/2012   K 4.1 02/29/2012   CL 105 02/29/2012   CREATININE 0.7 02/29/2012   BUN 16.0 02/29/2012   CO2 26 02/29/2012   INR 0.88 06/19/2010     ASSESSMENT AND PLAN:      1. History of lymphoma: I discussed with Debra Schwartz that she is still in remission. Per clinical history, exam, and lab, there is no sign of recurrence. I again recommended observation. She will have a restaging CT scan of the chest, abdomen, and pelvis in about 4 months. If she has palpable adenopathy prior to her next visit, we can get scans sooner.  2. History of thyroiditis: resolved; has not recurred. She follows with Dr. Sharl Ma.  3. Age-appropriate cancer screening:  She claims to be up to date with her colonoscopy, mammogram and Papsmear. She has already had a flu vaccine and she recently had a shingles vaccine. 4. Follow up:  In about 4 months.    The length of time of the face-to-face encounter was 15 minutes. More than 50% of time was spent counseling and coordination of care.

## 2012-06-22 ENCOUNTER — Ambulatory Visit (HOSPITAL_COMMUNITY)
Admission: RE | Admit: 2012-06-22 | Discharge: 2012-06-22 | Disposition: A | Discharge status: Home / Self Care | Payer: BC Managed Care – PPO | Source: Ambulatory Visit | Attending: Oncology | Admitting: Oncology

## 2012-06-22 ENCOUNTER — Other Ambulatory Visit (HOSPITAL_BASED_OUTPATIENT_CLINIC_OR_DEPARTMENT_OTHER): Payer: BC Managed Care – PPO | Admitting: Lab

## 2012-06-22 ENCOUNTER — Encounter (HOSPITAL_COMMUNITY): Payer: Self-pay

## 2012-06-22 DIAGNOSIS — K7689 Other specified diseases of liver: Secondary | ICD-10-CM | POA: Insufficient documentation

## 2012-06-22 DIAGNOSIS — C8299 Follicular lymphoma, unspecified, extranodal and solid organ sites: Secondary | ICD-10-CM

## 2012-06-22 DIAGNOSIS — Z9221 Personal history of antineoplastic chemotherapy: Secondary | ICD-10-CM | POA: Insufficient documentation

## 2012-06-22 DIAGNOSIS — C8200 Follicular lymphoma grade I, unspecified site: Secondary | ICD-10-CM

## 2012-06-22 DIAGNOSIS — E041 Nontoxic single thyroid nodule: Secondary | ICD-10-CM | POA: Insufficient documentation

## 2012-06-22 DIAGNOSIS — I700 Atherosclerosis of aorta: Secondary | ICD-10-CM | POA: Insufficient documentation

## 2012-06-22 LAB — COMPREHENSIVE METABOLIC PANEL (CC13)
ALT: 23 U/L (ref 0–55)
AST: 18 U/L (ref 5–34)
BUN: 16.8 mg/dL (ref 7.0–26.0)
Creatinine: 0.7 mg/dL (ref 0.6–1.1)
Total Bilirubin: 0.35 mg/dL (ref 0.20–1.20)

## 2012-06-22 LAB — CBC WITH DIFFERENTIAL/PLATELET
BASO%: 1.3 % (ref 0.0–2.0)
EOS%: 2.5 % (ref 0.0–7.0)
HCT: 39.9 % (ref 34.8–46.6)
LYMPH%: 30.9 % (ref 14.0–49.7)
MCH: 30.1 pg (ref 25.1–34.0)
MCHC: 33.4 g/dL (ref 31.5–36.0)
MCV: 90 fL (ref 79.5–101.0)
NEUT%: 56.7 % (ref 38.4–76.8)
Platelets: 211 10*3/uL (ref 145–400)

## 2012-06-22 LAB — LACTATE DEHYDROGENASE (CC13): LDH: 191 U/L (ref 125–245)

## 2012-06-22 LAB — URIC ACID (CC13): Uric Acid, Serum: 4.8 mg/dl (ref 2.6–7.4)

## 2012-06-22 MED ORDER — IOHEXOL 300 MG/ML  SOLN
100.0000 mL | Freq: Once | INTRAMUSCULAR | Status: AC | PRN
  Administered 2012-06-22: 100 mL via INTRAVENOUS

## 2012-06-24 NOTE — Patient Instructions (Signed)
1.  Diagnosis:  History of follicular lymphoma. 2.  Status:  Continue to be in remission. 3.  Follow up:  Per clinical trial recommendation.

## 2012-06-25 ENCOUNTER — Telehealth: Payer: Self-pay | Admitting: Oncology

## 2012-06-25 ENCOUNTER — Ambulatory Visit (HOSPITAL_BASED_OUTPATIENT_CLINIC_OR_DEPARTMENT_OTHER): Payer: BC Managed Care – PPO | Admitting: Oncology

## 2012-06-25 VITALS — BP 144/88 | HR 74 | Temp 97.1°F | Resp 20 | Ht 65.5 in | Wt 167.1 lb

## 2012-06-25 DIAGNOSIS — C8299 Follicular lymphoma, unspecified, extranodal and solid organ sites: Secondary | ICD-10-CM

## 2012-06-25 DIAGNOSIS — C8200 Follicular lymphoma grade I, unspecified site: Secondary | ICD-10-CM

## 2012-06-25 NOTE — Telephone Encounter (Signed)
gv and printed appt schedule for pt for June and Oct

## 2012-06-25 NOTE — Progress Notes (Signed)
[facility]  Telephone:([phone] Fax:([phone]   OFFICE PROGRESS NOTE   Cc:  MCNEILL,WENDY, MD  DIAGNOSIS:  history of grade 2 follicular lymphoma, stage III. Her IPI score is 2 given stage III and more than 4 lymph node involvement (albeit low tumor burden) which is intermediate risk.   PAST THERAPY: clinical trial [facility] 78295 with Rituxan/Revlimid between 02/2010 and 01/2011.   CURRENT THERAPY: watchful observation.  INTERVAL HISTORY: Debra Schwartz 57 y.o. female returns for regular follow up with her husband.  She reports feeling well with no symptoms.  She still works as a Midwife.  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  Patient denies fever, anorexia, weight loss, fatigue, headache, visual changes, confusion, drenching night sweats, palpable lymph node swelling, mucositis, odynophagia, dysphagia, nausea vomiting, jaundice, chest pain, palpitation, shortness of breath, dyspnea on exertion, productive cough, gum bleeding, epistaxis, hematemesis, hemoptysis, abdominal pain, abdominal swelling, early satiety, melena, hematochezia, hematuria, skin rash, spontaneous bleeding, joint swelling, joint pain, heat or cold intolerance, bowel bladder incontinence, back pain, focal motor weakness, paresthesia, depression.     Past Medical History  Diagnosis Date  . Hyperthyroidism   . Thyroiditis   . Follicular lymphoma grade I   . nhl dx'd 2011    No past surgical history on file.  Current Outpatient Prescriptions  Medication Sig Dispense Refill  . cetirizine (ZYRTEC) 10 MG tablet Take 10 mg by mouth daily.      Marland Kitchen estrogens, conjugated, (PREMARIN) 0.45 MG tablet Take 0.45 mg by mouth daily.        . fexofenadine (ALLEGRA) 180 MG tablet Take 180 mg by mouth daily.      . fish oil-omega-3 fatty acids 1000 MG capsule Take 2 g by mouth 2 (two) times daily.        . Plant Sterols and Stanols (CHOLEST OFF PO) Take 900 mg by mouth 2 (two) times daily.       .  progesterone (PROMETRIUM) 200 MG capsule Take 200 mg by mouth. Take once every 3 months for 12 days      . VITAMIN D, CHOLECALCIFEROL, PO Take 5,000 Int'l Units by mouth daily.       No current facility-administered medications for this visit.    ALLERGIES:  has No Known Allergies.  REVIEW OF SYSTEMS:  The rest of the 14-point review of system was negative.   Filed Vitals:   06/25/12 1451  BP: 144/88  Pulse: 74  Temp: 97.1 F (36.2 C)  Resp: 20   Wt Readings from Last 3 Encounters:  06/25/12 167 lb 1.6 oz (75.796 kg)  02/29/12 166 lb 4.8 oz (75.433 kg)  10/28/11 166 lb (75.297 kg)   ECOG Performance status: 0  PHYSICAL EXAMINATION:   General:  well-nourished woman in no acute distress.  Eyes:  no scleral icterus.  ENT:  There were no oropharyngeal lesions.  Neck was without thyromegaly.  Lymphatics:  Negative cervical, supraclavicular or axillary adenopathy.  Respiratory: lungs were clear bilaterally without wheezing or crackles.  Cardiovascular:  Regular rate and rhythm, S1/S2, without murmur, rub or gallop.  There was no pedal edema.  GI:  abdomen was soft, flat, nontender, nondistended, without organomegaly.  Muscoloskeletal:  no spinal tenderness of palpation of vertebral spine.  Skin exam was without echymosis, petichae.  Neuro exam was nonfocal.  Patient was able to get on and off exam table without assistance.  Gait was normal.  Patient was alerted and oriented.  Attention was good.  Language was appropriate.  Mood was normal without depression.  Speech was not pressured.  Thought content was not tangential.     LABORATORY/RADIOLOGY DATA:  Lab Results  Component Value Date   WBC 4.5 06/22/2012   HGB 13.4 06/22/2012   HCT 39.9 06/22/2012   PLT 211 06/22/2012   GLUCOSE 89 06/22/2012   ALKPHOS 99 06/22/2012   ALT 23 06/22/2012   AST 18 06/22/2012   NA 139 06/22/2012   K 3.8 06/22/2012   CL 102 06/22/2012   CREATININE 0.7 06/22/2012   BUN 16.8 06/22/2012   CO2 27 06/22/2012   INR 0.88  06/19/2010   IMAGING:   I personally reviewed the following CT obtained on 06/22/2012 and showed the images to the patient and her husband.   CT CHEST  Findings: Lung windows demonstrate no nodules or airspace  opacities.  Soft tissue windows demonstrate no supraclavicular adenopathy.  Tiny right thyroid low density nodule is unchanged. No axillary  adenopathy. Normal heart size without pericardial or pleural  effusion. No central pulmonary embolism, on this non-dedicated  study. No mediastinal or hilar adenopathy. Fluid level in the  upper thoracic esophagus on image 17/series 2 is subtle.  IMPRESSION:  1. No acute process or evidence of active lymphoma within the  chest.  2. Esophageal air fluid level suggests dysmotility or  gastroesophageal reflux.  CT ABDOMEN AND PELVIS  Findings: Possible mild hepatic steatosis, without focal liver  lesions. Similar too small to characterize right hepatic lobe low  density lesion on image 69/series 2. Focal steatosis adjacent the  falciform ligament. A splenule. Normal stomach, pancreas,  gallbladder, biliary tract, adrenal glands. Interpolar right renal  cyst. Normal left kidney.  A circumaortic left renal vein. Early aortic atherosclerosis. No  retroperitoneal or retrocrural adenopathy.  Colonic stool burden suggests constipation.  Normal terminal ileum and appendix. Normal small bowel without  abdominal ascites.  New small inguinal nodes which are unchanged. No pelvic adenopathy.  Normal urinary bladder and uterus. No adnexal mass. No  significant free fluid. Question mild pelvic floor laxity.  No acute osseous abnormality.  IMPRESSION:  1. No acute process or evidence of active lymphoma within the  abdomen or pelvis.  2. Suspect mild hepatic steatosis.  3. Possible constipation.    ASSESSMENT AND PLAN:    1. History of lymphoma: continue to be in remission on clinical history, lab, and imaging CT scan.   2. History of thyroiditis:  resolved; has not recurred. She follows with Dr. Sharl Ma.  Patient is due to have a repeat neck US soon.  3. Atherosclerotic changes on CT scan:  I advised patient that this is a very nonspecific finding. However, to reduce her risk of cardiovascular disease, I advised her to exercise daily (which she is not doing currently), lose weight, low fat/salt diet, lower blood pressure, possible take Aspirin 81mg  PO daily.  She inquired about the need to be referred to cardiology.  I deferred this decision to her PCP.  4. Follow up:  In about 4 months.  Next restaging CT scan in 8 months but sooner if concerning symptoms.    The length of time of the face-to-face encounter was 15 minutes. More than 50% of time was spent counseling and coordination of care.

## 2012-07-03 ENCOUNTER — Encounter: Payer: Self-pay | Admitting: Oncology

## 2012-07-04 ENCOUNTER — Other Ambulatory Visit: Payer: Self-pay | Admitting: Oncology

## 2012-07-09 ENCOUNTER — Other Ambulatory Visit: Payer: Self-pay | Admitting: Oncology

## 2012-07-26 ENCOUNTER — Telehealth: Payer: Self-pay | Admitting: *Deleted

## 2012-07-26 NOTE — Telephone Encounter (Signed)
Pt called and left a message wanting to cancel her appt for 10/26/2012. She requested that I r/s her appt for the date of 10/30/2012. Lm message w/ pt that I r/s her appt for 10/30/2012 with a 2:45pm lab and a 3:15pm ov. Pt is aware.

## 2012-10-16 ENCOUNTER — Encounter: Payer: Self-pay | Admitting: Oncology

## 2012-10-16 NOTE — Progress Notes (Signed)
After multiple attempts I was finally routed to customer service, Gunnar Fusi T. told me the claim was denied not for coding but [facility] had requested on July 09, 2012 from Medical Records all records pertaining to these procedures, reason why they were requested and clinical notes.    The patient stated she had not had a Chest Ct Scan done, but on June 22, 2012 in Epic both the results for her chest, abd and pelvis are given. These procedures were ordered by Clenton Pare and were the CPT codes that were authorized.  Per Gunnar Fusi T. the location does not matter with [employer] because it is still within the Health [facility].  These scans do show [facility] as the location on this patients appointment desk as of 06/22/2012.  The Claim Number given to be by [facility] is#  [id number].  I dont know what else I can do with this issue, the problem seems to be with the Medical Record Request that was submitted.  I hope this may shed some light on the problem.  This is an email I sent to Daisy Floro, and Trey Paula, hoping this will help with the problem.  Bonita Quin [phone]

## 2012-10-24 ENCOUNTER — Other Ambulatory Visit: Payer: Self-pay | Admitting: Lab

## 2012-10-25 ENCOUNTER — Encounter: Payer: Self-pay | Admitting: *Deleted

## 2012-10-25 DIAGNOSIS — C8200 Follicular lymphoma grade I, unspecified site: Secondary | ICD-10-CM

## 2012-10-26 ENCOUNTER — Other Ambulatory Visit: Payer: Self-pay | Admitting: Lab

## 2012-10-26 ENCOUNTER — Ambulatory Visit: Payer: Self-pay | Admitting: Oncology

## 2012-10-30 ENCOUNTER — Other Ambulatory Visit (HOSPITAL_BASED_OUTPATIENT_CLINIC_OR_DEPARTMENT_OTHER): Payer: BC Managed Care – PPO | Admitting: Lab

## 2012-10-30 ENCOUNTER — Encounter: Payer: Self-pay | Admitting: Oncology

## 2012-10-30 ENCOUNTER — Ambulatory Visit (HOSPITAL_BASED_OUTPATIENT_CLINIC_OR_DEPARTMENT_OTHER): Payer: BC Managed Care – PPO | Admitting: Oncology

## 2012-10-30 VITALS — BP 137/85 | HR 69 | Temp 98.0°F | Resp 18 | Ht 65.5 in | Wt 162.6 lb

## 2012-10-30 DIAGNOSIS — C8200 Follicular lymphoma grade I, unspecified site: Secondary | ICD-10-CM

## 2012-10-30 DIAGNOSIS — C8299 Follicular lymphoma, unspecified, extranodal and solid organ sites: Secondary | ICD-10-CM

## 2012-10-30 LAB — CBC WITH DIFFERENTIAL/PLATELET
Basophils Absolute: 0.1 10*3/uL (ref 0.0–0.1)
EOS%: 2 % (ref 0.0–7.0)
HCT: 37.2 % (ref 34.8–46.6)
HGB: 12.8 g/dL (ref 11.6–15.9)
MCH: 30.5 pg (ref 25.1–34.0)
MCHC: 34.3 g/dL (ref 31.5–36.0)
MCV: 88.9 fL (ref 79.5–101.0)
MONO%: 6.6 % (ref 0.0–14.0)
NEUT%: 66.3 % (ref 38.4–76.8)
RDW: 13.1 % (ref 11.2–14.5)

## 2012-10-30 LAB — COMPREHENSIVE METABOLIC PANEL (CC13)
Alkaline Phosphatase: 94 U/L (ref 40–150)
BUN: 14.9 mg/dL (ref 7.0–26.0)
Glucose: 116 mg/dl — ABNORMAL HIGH (ref 70–99)
Total Bilirubin: 0.26 mg/dL (ref 0.20–1.20)

## 2012-10-30 LAB — URIC ACID (CC13): Uric Acid, Serum: 5.1 mg/dl (ref 2.6–7.4)

## 2012-10-30 NOTE — Progress Notes (Signed)
[facility]  Telephone:([phone] Fax:([phone]   OFFICE PROGRESS NOTE   Cc:  MCNEILL,WENDY, MD  DIAGNOSIS:  history of grade 2 follicular lymphoma, stage III. Her IPI score is 2 given stage III and more than 4 lymph node involvement (albeit low tumor burden) which is intermediate risk.   PAST THERAPY: clinical trial CALGB 14782 with Rituxan/Revlimid between 02/2010 and 01/2011.   CURRENT THERAPY: watchful observation.  INTERVAL HISTORY: Debra Schwartz 57 y.o. female returns for regular follow up by herself.  She reports feeling well with no symptoms.  She still works as a Midwife.  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  Patient denies fever, anorexia, weight loss, fatigue, headache, visual changes, confusion, drenching night sweats, palpable lymph node swelling, mucositis, odynophagia, dysphagia, nausea vomiting, jaundice, chest pain, palpitation, shortness of breath, dyspnea on exertion, productive cough, gum bleeding, epistaxis, hematemesis, hemoptysis, abdominal pain, abdominal swelling, early satiety, melena, hematochezia, hematuria, skin rash, spontaneous bleeding, joint swelling, joint pain, heat or cold intolerance, bowel bladder incontinence, back pain, focal motor weakness, paresthesia, depression.     Past Medical History  Diagnosis Date  . Hyperthyroidism   . Thyroiditis   . Follicular lymphoma grade I   . nhl dx'd 2011    No past surgical history on file.  Current Outpatient Prescriptions  Medication Sig Dispense Refill  . cetirizine (ZYRTEC) 10 MG tablet Take 10 mg by mouth daily.      Marland Kitchen estrogens, conjugated, (PREMARIN) 0.45 MG tablet Take 0.45 mg by mouth daily.        . fexofenadine (ALLEGRA) 180 MG tablet Take 180 mg by mouth daily.      . fish oil-omega-3 fatty acids 1000 MG capsule Take 2 g by mouth 2 (two) times daily.        . Plant Sterols and Stanols (CHOLEST OFF PO) Take 900 mg by mouth 2 (two) times daily.       .  progesterone (PROMETRIUM) 200 MG capsule Take 200 mg by mouth. Take once every 3 months for 12 days      . VITAMIN D, CHOLECALCIFEROL, PO Take 5,000 Int'l Units by mouth daily.       No current facility-administered medications for this visit.    ALLERGIES:  has No Known Allergies.  REVIEW OF SYSTEMS:  The rest of the 14-point review of system was negative.   Filed Vitals:   10/30/12 1512  BP: 137/85  Pulse: 69  Temp: 98 F (36.7 C)  Resp: 18   Wt Readings from Last 3 Encounters:  10/30/12 162 lb 9.6 oz (73.755 kg)  06/25/12 167 lb 1.6 oz (75.796 kg)  02/29/12 166 lb 4.8 oz (75.433 kg)   ECOG Performance status: 0  PHYSICAL EXAMINATION:   General:  well-nourished woman in no acute distress.  Eyes:  no scleral icterus.  ENT:  There were no oropharyngeal lesions.  Neck was without thyromegaly.  Lymphatics:  Negative cervical, supraclavicular or axillary adenopathy.  Respiratory: lungs were clear bilaterally without wheezing or crackles.  Cardiovascular:  Regular rate and rhythm, S1/S2, without murmur, rub or gallop.  There was no pedal edema.  GI:  abdomen was soft, flat, nontender, nondistended, without organomegaly.  Muscoloskeletal:  no spinal tenderness of palpation of vertebral spine.  Skin exam was without echymosis, petichae.  Neuro exam was nonfocal.  Patient was able to get on and off exam table without assistance.  Gait was normal.  Patient was alerted and oriented.  Attention was good.  Language was appropriate.  Mood was normal without depression.  Speech was not pressured.  Thought content was not tangential.     LABORATORY/RADIOLOGY DATA:  Lab Results  Component Value Date   WBC 6.7 10/30/2012   HGB 12.8 10/30/2012   HCT 37.2 10/30/2012   PLT 233 10/30/2012   GLUCOSE 116* 10/30/2012   ALKPHOS 94 10/30/2012   ALT 23 10/30/2012   AST 19 10/30/2012   NA 140 10/30/2012   K 3.5 10/30/2012   CL 106 10/30/2012   CREATININE 0.8 10/30/2012   BUN 14.9 10/30/2012   CO2 24  10/30/2012   INR 0.88 06/19/2010    ASSESSMENT AND PLAN:    1. History of lymphoma: continue to be in remission on clinical history, lab, and physical exam.  Discussed the risks/benefits of frequent restaging CT scans. The patient would like to proceed with annual CT scans only at this time. Next CT will be due around February 2015. 2. History of thyroiditis: resolved; has not recurred. She follows with Dr. Sharl Ma.  Patient is due to have a repeat neck US soon.  3. Atherosclerotic changes on CT scan:  I advised patient that this is a very nonspecific finding. However, to reduce her risk of cardiovascular disease, I advised her to exercise daily (which she is not doing currently), lose weight, low fat/salt diet, lower blood pressure, possible take Aspirin 81mg  PO daily.  She inquired about the need to be referred to cardiology.  I deferred this decision to her PCP.  4. Follow up:  In about 4 months.      The length of time of the face-to-face encounter was 15 minutes. More than 50% of time was spent counseling and coordination of care.

## 2013-02-18 ENCOUNTER — Telehealth: Payer: Self-pay | Admitting: Hematology and Oncology

## 2013-02-18 NOTE — Telephone Encounter (Signed)
Changed time of 10/10 lb.fu to 1:30pm. lmonvm for pt and mailed schedule.

## 2013-02-21 ENCOUNTER — Other Ambulatory Visit: Payer: Self-pay | Admitting: Hematology and Oncology

## 2013-02-21 ENCOUNTER — Other Ambulatory Visit (HOSPITAL_COMMUNITY): Payer: Self-pay

## 2013-02-21 DIAGNOSIS — C8200 Follicular lymphoma grade I, unspecified site: Secondary | ICD-10-CM

## 2013-02-22 ENCOUNTER — Encounter: Payer: Self-pay | Admitting: Hematology and Oncology

## 2013-02-22 ENCOUNTER — Ambulatory Visit (HOSPITAL_BASED_OUTPATIENT_CLINIC_OR_DEPARTMENT_OTHER): Payer: BC Managed Care – PPO | Admitting: Hematology and Oncology

## 2013-02-22 ENCOUNTER — Other Ambulatory Visit (HOSPITAL_BASED_OUTPATIENT_CLINIC_OR_DEPARTMENT_OTHER): Payer: BC Managed Care – PPO | Admitting: Lab

## 2013-02-22 ENCOUNTER — Telehealth: Payer: Self-pay | Admitting: Hematology and Oncology

## 2013-02-22 VITALS — BP 122/61 | HR 71 | Temp 98.2°F | Resp 20 | Ht 65.5 in | Wt 161.3 lb

## 2013-02-22 DIAGNOSIS — C8299 Follicular lymphoma, unspecified, extranodal and solid organ sites: Secondary | ICD-10-CM

## 2013-02-22 DIAGNOSIS — C8200 Follicular lymphoma grade I, unspecified site: Secondary | ICD-10-CM

## 2013-02-22 DIAGNOSIS — Z23 Encounter for immunization: Secondary | ICD-10-CM

## 2013-02-22 LAB — COMPREHENSIVE METABOLIC PANEL (CC13)
ALT: 18 U/L (ref 0–55)
AST: 16 U/L (ref 5–34)
Albumin: 3.3 g/dL — ABNORMAL LOW (ref 3.5–5.0)
Alkaline Phosphatase: 93 U/L (ref 40–150)
Anion Gap: 8 mEq/L (ref 3–11)
BUN: 17.4 mg/dL (ref 7.0–26.0)
CO2: 28 mEq/L (ref 22–29)
Calcium: 9.1 mg/dL (ref 8.4–10.4)
Chloride: 105 mEq/L (ref 98–109)
Creatinine: 0.7 mg/dL (ref 0.6–1.1)
Glucose: 104 mg/dl (ref 70–140)
Potassium: 4 mEq/L (ref 3.5–5.1)
Sodium: 141 mEq/L (ref 136–145)
Total Bilirubin: 0.23 mg/dL (ref 0.20–1.20)
Total Protein: 6.9 g/dL (ref 6.4–8.3)

## 2013-02-22 LAB — CBC WITH DIFFERENTIAL/PLATELET
BASO%: 0.9 % (ref 0.0–2.0)
Basophils Absolute: 0.1 10*3/uL (ref 0.0–0.1)
EOS%: 1.6 % (ref 0.0–7.0)
Eosinophils Absolute: 0.1 10*3/uL (ref 0.0–0.5)
HCT: 37.8 % (ref 34.8–46.6)
HGB: 12.7 g/dL (ref 11.6–15.9)
LYMPH%: 20.6 % (ref 14.0–49.7)
MCH: 30.4 pg (ref 25.1–34.0)
MCHC: 33.6 g/dL (ref 31.5–36.0)
MCV: 90.5 fL (ref 79.5–101.0)
MONO#: 0.6 10*3/uL (ref 0.1–0.9)
MONO%: 8.9 % (ref 0.0–14.0)
NEUT#: 4.8 10*3/uL (ref 1.5–6.5)
NEUT%: 68 % (ref 38.4–76.8)
Platelets: 233 10*3/uL (ref 145–400)
RBC: 4.18 10*6/uL (ref 3.70–5.45)
RDW: 13 % (ref 11.2–14.5)
WBC: 7 10*3/uL (ref 3.9–10.3)
lymph#: 1.4 10*3/uL (ref 0.9–3.3)

## 2013-02-22 MED ORDER — INFLUENZA VAC SPLIT QUAD 0.5 ML IM SUSP
0.5000 mL | INTRAMUSCULAR | Status: AC
  Administered 2013-02-22: 0.5 mL via INTRAMUSCULAR
  Filled 2013-02-22: qty 0.5

## 2013-02-22 NOTE — Telephone Encounter (Signed)
Gave pt apt for lab and Md on April 2015

## 2013-02-22 NOTE — Progress Notes (Signed)
[facility] OFFICE PROGRESS NOTE  MCNEILL,WENDY, MD  DIAGNOSIS: Follicular lymphoma stage IIIa status post treatment for further management  SUMMARY OF ONCOLOGIC HISTORY: This patient was discovered to have lymphoma on a routine screening mammogram. In July of 2011, mammogram picked up abnormalities leading to further imaging studies. CT scan from August 2011 show lymphoma involvement both above and below the diaphragm. She had ultrasound-guided biopsy of the lymph node that came back positive for follicular lymphoma. The patient was offered clinical trial that she participated in [facility] 09/20/2001 with rituximab and Revlimid between October 2011 to 01/16/2011. Serial imaging studies over the past 3 years show the patient has achieved complete remission. She is currently being observed.  INTERVAL HISTORY: CARLOYN LAHUE 57 y.o. female returns for further followup related to her history of lymphoma. She is doing very well. Denies any palpable lymphadenopathy. Denies any history of recurrent infection. No recent bleeding. Denies any night sweats, fevers, chills, anorexia, or abnormal weight loss. She remains very active and able to perform all activities of daily living.  I have reviewed the past medical history, past surgical history, social history and family history with the patient and they are unchanged from previous note.  ALLERGIES:  has No Known Allergies.  MEDICATIONS: Current outpatient prescriptions:cetirizine (ZYRTEC) 10 MG tablet, Take 10 mg by mouth daily., Disp: , Rfl: ;  estrogens, conjugated, (PREMARIN) 0.45 MG tablet, Take 0.45 mg by mouth daily.  , Disp: , Rfl: ;  fexofenadine (ALLEGRA) 180 MG tablet, Take 180 mg by mouth daily., Disp: , Rfl: ;  fish oil-omega-3 fatty acids 1000 MG capsule, Take 2 g by mouth 2 (two) times daily.  , Disp: , Rfl:  Plant Sterols and Stanols (CHOLEST OFF PO), Take 900 mg by mouth 2 (two) times daily. , Disp: , Rfl: ;  progesterone  (PROMETRIUM) 200 MG capsule, Take 200 mg by mouth. Take once every 3 months for 12 days, Disp: , Rfl: ;  VITAMIN D, CHOLECALCIFEROL, PO, Take 5,000 Int'l Units by mouth daily., Disp: , Rfl:  Current facility-administered medications:[START ON 02/23/2013] influenza vac split quadrivalent PF (FLUARIX) injection 0.5 mL, 0.5 mL, Intramuscular, Tomorrow-1000, Jarion Hawthorne, MD  REVIEW OF SYSTEMS:   Constitutional: Denies fevers, chills or abnormal weight loss Eyes: Denies blurriness of vision Ears, nose, mouth, throat, and face: Denies mucositis or sore throat Respiratory: Denies cough, dyspnea or wheezes Cardiovascular: Denies palpitation, chest discomfort or lower extremity swelling Gastrointestinal:  Denies nausea, heartburn or change in bowel habits Skin: Denies abnormal skin rashes Lymphatics: Denies new lymphadenopathy or easy bruising Neurological:Denies numbness, tingling or new weaknesses Behavioral/Psych: Mood is stable, no new changes  All other systems were reviewed with the patient and are negative.  PHYSICAL EXAMINATION: ECOG PERFORMANCE STATUS: 0 - Asymptomatic  Filed Vitals:   02/22/13 1348  BP: 122/61  Pulse: 71  Temp: 98.2 F (36.8 C)  Resp: 20   Filed Weights   02/22/13 1348  Weight: 161 lb 4.8 oz (73.165 kg)    GENERAL:alert, no distress and comfortable SKIN: skin color, texture, turgor are normal, no rashes or significant lesions EYES: normal, Conjunctiva are pink and non-injected, sclera clear OROPHARYNX:no exudate, no erythema and lips, buccal mucosa, and tongue normal  NECK: supple, thyroid normal size, non-tender, without nodularity LYMPH:  no palpable lymphadenopathy in the cervical, axillary or inguinal LUNGS: clear to auscultation and percussion with normal breathing effort HEART: regular rate & rhythm and no murmurs and no lower extremity edema ABDOMEN:abdomen soft, non-tender  and normal bowel sounds no splenomegaly Musculoskeletal:no cyanosis of digits  and no clubbing  NEURO: alert & oriented x 3 with fluent speech, no focal motor/sensory deficits  LABORATORY DATA:  I have reviewed the data as listed    Component Value Date/Time   NA 140 10/30/2012 1443   NA 139 10/26/2011 0829   NA 140 06/20/2011 1433   K 3.5 10/30/2012 1443   K 4.7 10/26/2011 0829   K 3.8 06/20/2011 1433   CL 106 10/30/2012 1443   CL 102 10/26/2011 0829   CL 104 06/20/2011 1433   CO2 24 10/30/2012 1443   CO2 29 10/26/2011 0829   CO2 29 06/20/2011 1433   GLUCOSE 116* 10/30/2012 1443   GLUCOSE 101 10/26/2011 0829   GLUCOSE 102* 06/20/2011 1433   BUN 14.9 10/30/2012 1443   BUN 18 10/26/2011 0829   BUN 20 06/20/2011 1433   CREATININE 0.8 10/30/2012 1443   CREATININE 0.9 10/26/2011 0829   CREATININE 0.73 06/20/2011 1433   CALCIUM 8.9 10/30/2012 1443   CALCIUM 8.8 10/26/2011 0829   CALCIUM 9.7 06/20/2011 1433   PROT 6.7 10/30/2012 1443   PROT 6.7 10/26/2011 0829   PROT 6.7 06/20/2011 1433   ALBUMIN 3.4* 10/30/2012 1443   ALBUMIN 4.5 06/20/2011 1433   AST 19 10/30/2012 1443   AST 21 10/26/2011 0829   AST 21 06/20/2011 1433   ALT 23 10/30/2012 1443   ALT 26 10/26/2011 0829   ALT 20 06/20/2011 1433   ALKPHOS 94 10/30/2012 1443   ALKPHOS 85* 10/26/2011 0829   ALKPHOS 94 06/20/2011 1433   BILITOT 0.26 10/30/2012 1443   BILITOT 0.60 10/26/2011 0829   BILITOT 0.3 06/20/2011 1433   GFRNONAA >60 06/21/2010 0435   GFRAA  Value: >60        The eGFR has been calculated using the MDRD equation. This calculation has not been validated in all clinical situations. eGFR's persistently <60 mL/min signify possible Chronic Kidney Disease. 06/21/2010 0435    No results found for this basename: SPEP, UPEP,  kappa and lambda light chains    Lab Results  Component Value Date   WBC 7.0 02/22/2013   NEUTROABS 4.8 02/22/2013   HGB 12.7 02/22/2013   HCT 37.8 02/22/2013   MCV 90.5 02/22/2013   PLT 233 02/22/2013      Chemistry      Component Value Date/Time   NA 140 10/30/2012 1443   NA 139 10/26/2011 0829   NA 140  06/20/2011 1433   K 3.5 10/30/2012 1443   K 4.7 10/26/2011 0829   K 3.8 06/20/2011 1433   CL 106 10/30/2012 1443   CL 102 10/26/2011 0829   CL 104 06/20/2011 1433   CO2 24 10/30/2012 1443   CO2 29 10/26/2011 0829   CO2 29 06/20/2011 1433   BUN 14.9 10/30/2012 1443   BUN 18 10/26/2011 0829   BUN 20 06/20/2011 1433   CREATININE 0.8 10/30/2012 1443   CREATININE 0.9 10/26/2011 0829   CREATININE 0.73 06/20/2011 1433      Component Value Date/Time   CALCIUM 8.9 10/30/2012 1443   CALCIUM 8.8 10/26/2011 0829   CALCIUM 9.7 06/20/2011 1433   ALKPHOS 94 10/30/2012 1443   ALKPHOS 85* 10/26/2011 0829   ALKPHOS 94 06/20/2011 1433   AST 19 10/30/2012 1443   AST 21 10/26/2011 0829   AST 21 06/20/2011 1433   ALT 23 10/30/2012 1443   ALT 26 10/26/2011 0829   ALT 20 06/20/2011 1433  BILITOT 0.26 10/30/2012 1443   BILITOT 0.60 10/26/2011 0829   BILITOT 0.3 06/20/2011 1433     ASSESSMENT: Follicular lymphoma, in remission  PLAN:  #1 follicular lymphoma, stage IIIa She has no evidence of disease recurrence. We'll continue history, physical examination, and blood work every 6 months. At this point in time, I would not recommend routine imaging study, she has signs and symptoms to suggest disease recurrence #2 preventive care I recommend vitamin D supplementation I also recommend avoiding excessive sun exposure and vigilant skin care and dermatologist review at least once a year or every other year to 2 risk of skin cancer with lymphoma patients. I also recommend influenza vaccination today and she is in agreement to proceed. . All questions were answered. The patient knows to call the clinic with any problems, questions or concerns. We can certainly see the patient much sooner if necessary. No barriers to learning was detected. I spent 15 minutes counseling the patient face to face. The total time spent in the appointment was 20 minutes and more than 50% was on counseling and review of test results     [facility], Dhani Imel, MD 02/22/2013  2:12 PM

## 2013-04-01 IMAGING — CT CT CHEST W/ CM
2 of 5 series · 16 of 46 positions shown, 18 images · IV contrast ([ID] OMNI [id number])
Comparison: Multiple priors, most recently CT of chest abdomen and
pelvis dated 02/10/2011.

CT CHEST

CLINICAL DATA: History of lymphoma.

CT CHEST, ABDOMEN AND PELVIS WITH CONTRAST
TECHNIQUE: Multidetector CT imaging of the chest, abdomen and
pelvis was performed following the standard protocol during bolus
administration of intravenous contrast.
Contrast: 100mL OMNIPAQUE IOHEXOL 300 MG/ML IV SOLN

[Series 2: cap with st · axial · 0.80mm/px · z∈[+1184,+1754]mm · 13 of 130 slices shown, 15 images]
[im 8/130  soft-tissue]
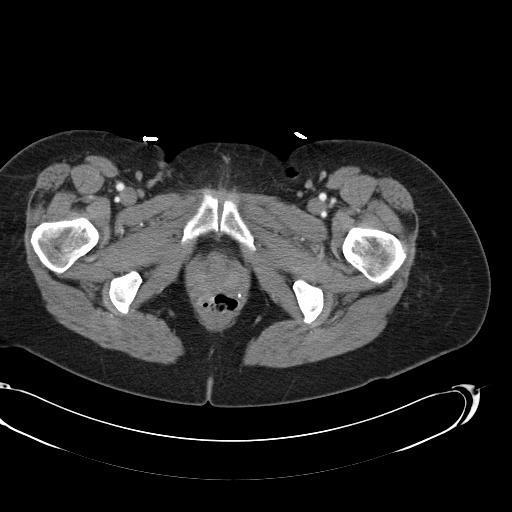
[im 8/130  bone]
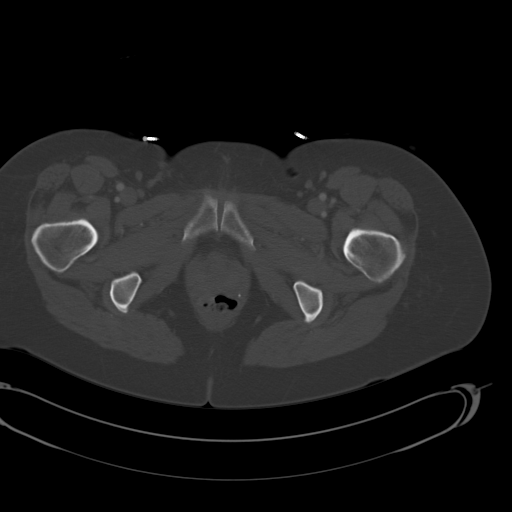
[im 15/130  soft-tissue]
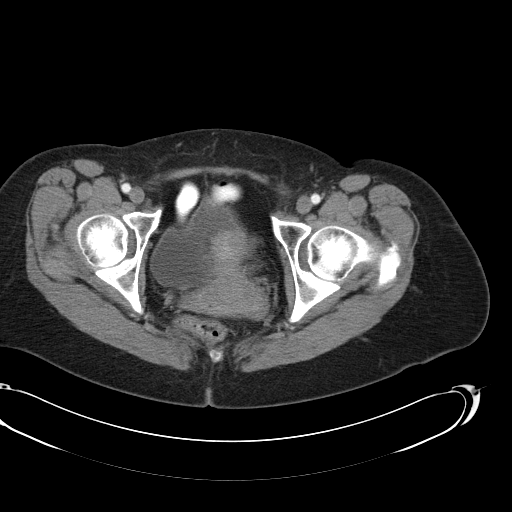
[im 29/130  soft-tissue]
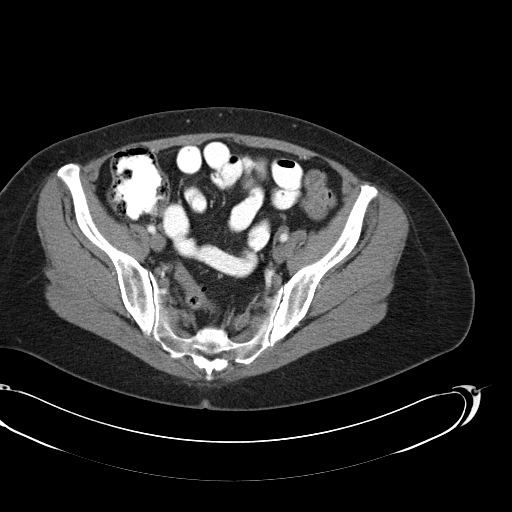
[im 36/130  soft-tissue]
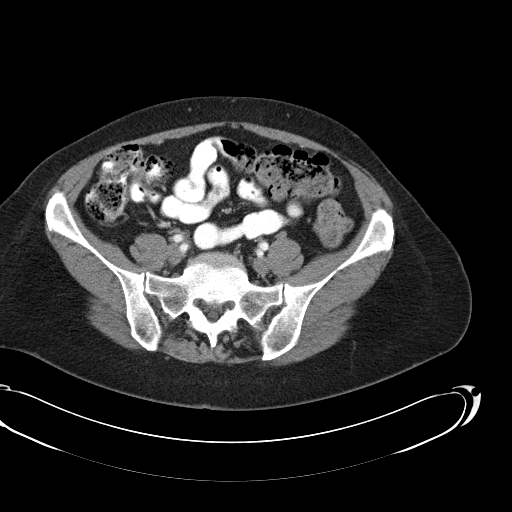
[im 44/130  soft-tissue]
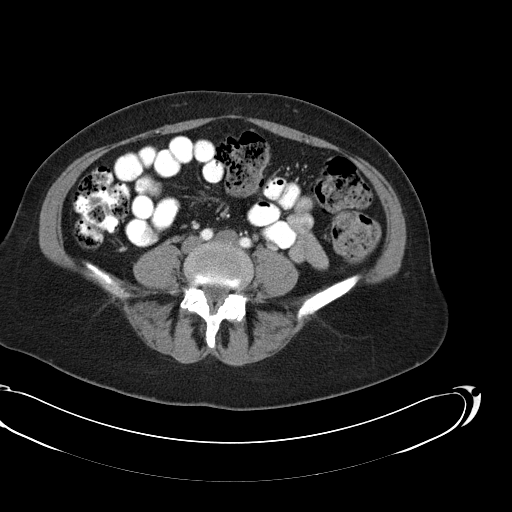
[im 58/130  soft-tissue]
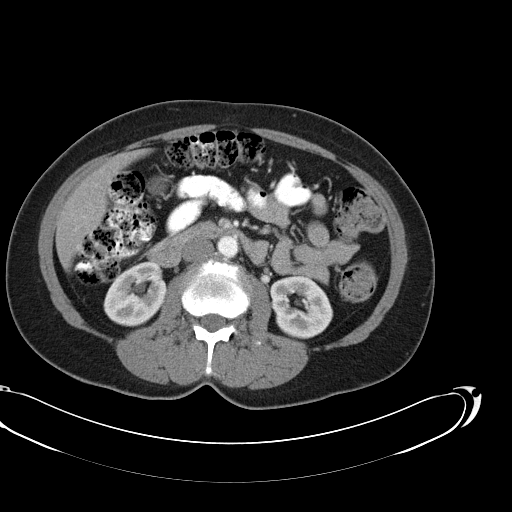
[im 65/130  soft-tissue]
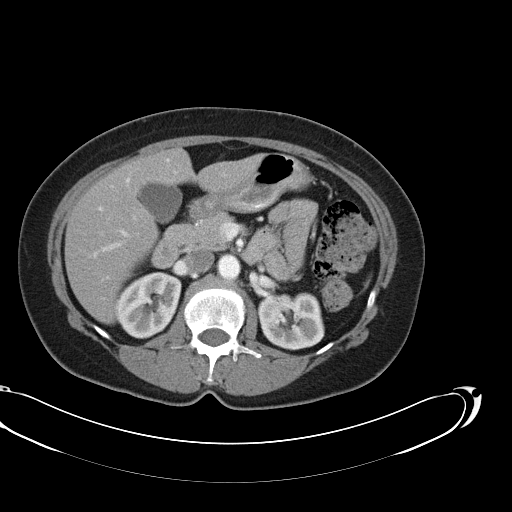
[im 72/130  soft-tissue]
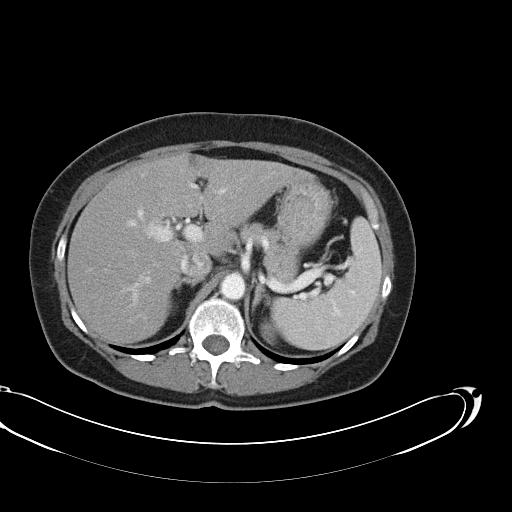
[im 87/130  soft-tissue]
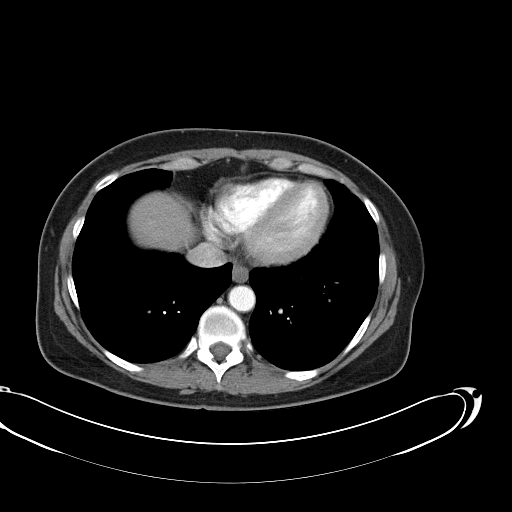
[im 87/130  bone]
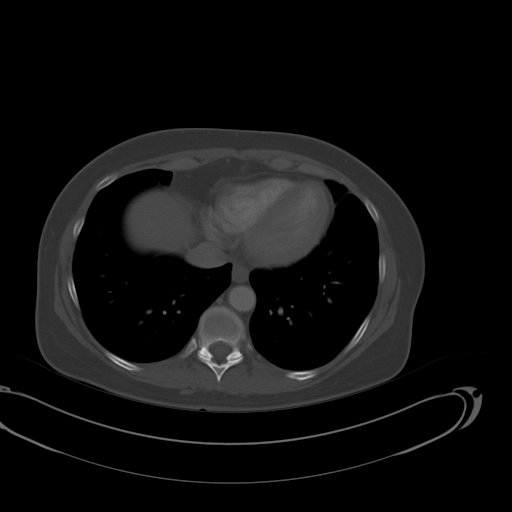
[im 94/130  soft-tissue]
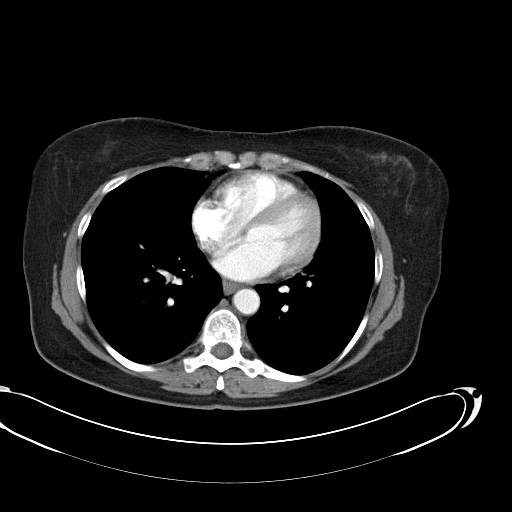
[im 101/130  soft-tissue]
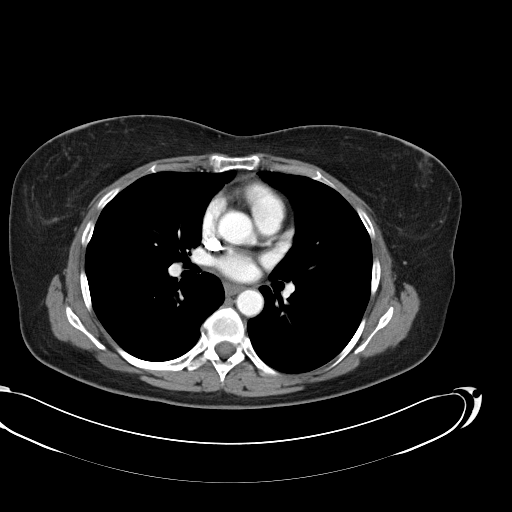
[im 115/130  soft-tissue]
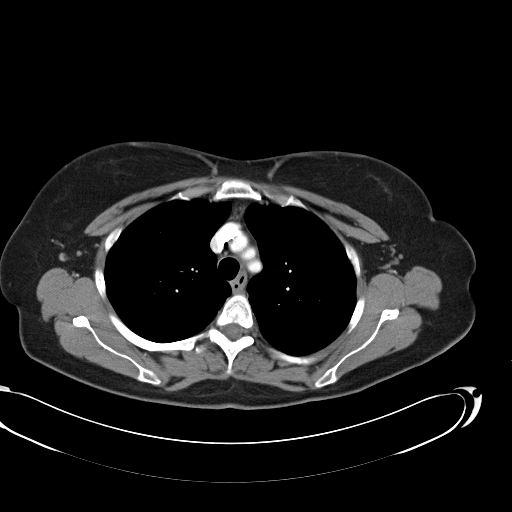
[im 122/130  soft-tissue]
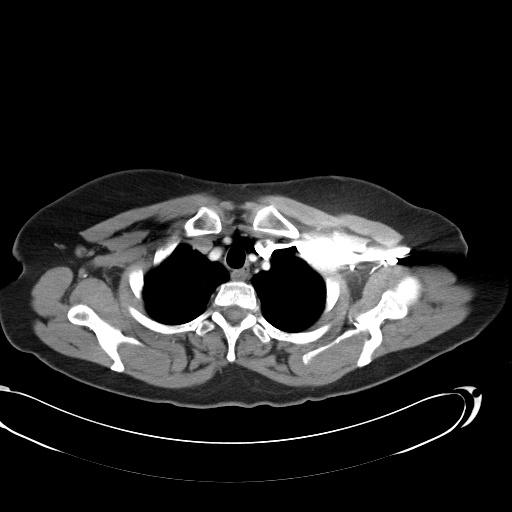

[Series 602: <mpr thick range> · coronal · 1.27mm/px · 3 of 82 slices shown]
[im 28/82  soft-tissue]
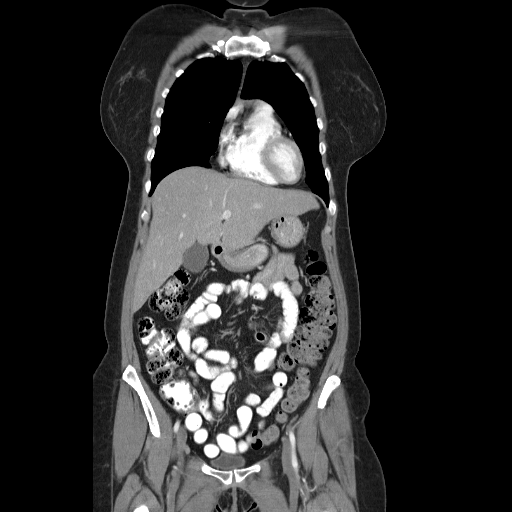
[im 37/82  soft-tissue]
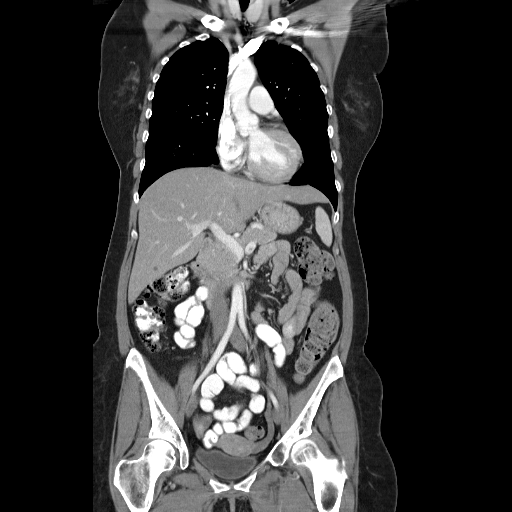
[im 46/82  soft-tissue]
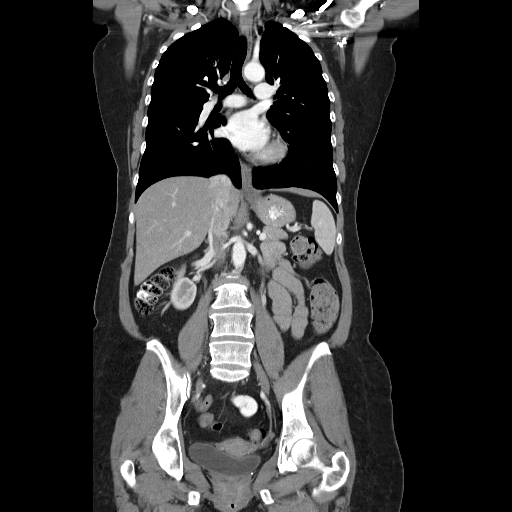

[16 of 46 positions shown; findings below may reference images not displayed]

FINDINGS: Mediastinum:  Heart size is normal.  No pericardial fluid,
thickening or calcification.  No acute abnormality of the thoracic
aorta or other great vessels of the mediastinum.  No filling
defects are noted within the central pulmonary arterial tree to
suggest large pulmonary emboli.  No pathologically enlarged
mediastinal, internal mammary or hilar lymph nodes.  The esophagus
is normal in appearance.  11 x 6 mm low attenuation lesion in the
posterior aspect of the right lobe of the thyroid gland is very
similar to prior study dated 02/10/2011.  A new 5 mm low
attenuation lesion is noted in the posterior aspect of the left
lobe of the thyroid gland.

Lungs/Pleura:  No consolidative airspace disease.  No pleural
effusions.  No suspicious appearing pulmonary nodules or masses.

Musculoskeletal:  No aggressive appearing lytic or blastic lesions
are noted in the visualized portions of the skeleton. No
pathologically enlarged axillary lymph nodes.
IMPRESSION: 1. No evidence of disease recurrence within the thorax.
2. Nonspecific low attenuation thyroid lesions, as above.  CT
findings and thyroid disease are highly nonspecific.  If there is
clinical concern for thyroid disease or neoplasm, further
evaluation with dedicated thyroid ultrasound may be warranted.

CT ABDOMEN AND PELVIS
FINDINGS: Abdomen/Pelvis: Small perfusion anomaly in segment 4B of the liver
adjacent the falciform ligament (unchanged), a benign finding.  A 6
mm low attenuation lesion in segment six of the liver is too small
to characterize, but unchanged compared to the prior study, and
strongly favored to represent a benign lesion such as a cyst.  The
infused appearance of the gallbladder, pancreas, spleen, bilateral
adrenal glands and bilateral kidneys is unremarkable. Small
splenule adjacent to the splenic hilum.  No ascites or
pneumoperitoneum.  No pathologic distention of bowel.  No abnormal
soft tissue masses or pathologically enlarged lymph nodes noted.
Normal appendix.  Uterus and ovaries are unremarkable in
appearance.

Musculoskeletal:  No aggressive appearing lytic or blastic lesions
noted in the visualized portions of the skeleton.
IMPRESSION: 1.  No findings to suggest disease recurrence within the abdomen or
pelvis.
2.  6 mm low attenuation lesion in segment six of the liver is too
small to characterize, but given that it is unchanged compared to
the prior examination, this is strongly favored to represent a
benign lesion such as a small cyst.

## 2013-08-12 ENCOUNTER — Telehealth: Payer: Self-pay | Admitting: Hematology and Oncology

## 2013-08-12 NOTE — Telephone Encounter (Signed)
cld pt to adv that appt on 4/6 had been chgd to am appt per Dr Dannielle Burn call)adv pt of new time @8 :17 labs & 9:00 Dr

## 2013-08-19 ENCOUNTER — Other Ambulatory Visit (HOSPITAL_BASED_OUTPATIENT_CLINIC_OR_DEPARTMENT_OTHER): Payer: BC Managed Care – PPO

## 2013-08-19 ENCOUNTER — Ambulatory Visit (HOSPITAL_BASED_OUTPATIENT_CLINIC_OR_DEPARTMENT_OTHER): Payer: BC Managed Care – PPO | Admitting: Hematology and Oncology

## 2013-08-19 ENCOUNTER — Encounter: Payer: Self-pay | Admitting: Hematology and Oncology

## 2013-08-19 ENCOUNTER — Encounter: Payer: Self-pay | Admitting: *Deleted

## 2013-08-19 VITALS — BP 143/78 | HR 79 | Temp 97.9°F | Resp 20 | Ht 65.5 in | Wt 165.6 lb

## 2013-08-19 DIAGNOSIS — Z23 Encounter for immunization: Secondary | ICD-10-CM

## 2013-08-19 DIAGNOSIS — C8299 Follicular lymphoma, unspecified, extranodal and solid organ sites: Secondary | ICD-10-CM

## 2013-08-19 DIAGNOSIS — C8200 Follicular lymphoma grade I, unspecified site: Secondary | ICD-10-CM

## 2013-08-19 LAB — COMPREHENSIVE METABOLIC PANEL (CC13)
ALK PHOS: 99 U/L (ref 40–150)
ALT: 20 U/L (ref 0–55)
ANION GAP: 11 meq/L (ref 3–11)
AST: 21 U/L (ref 5–34)
Albumin: 3.5 g/dL (ref 3.5–5.0)
BILIRUBIN TOTAL: 0.27 mg/dL (ref 0.20–1.20)
BUN: 16.2 mg/dL (ref 7.0–26.0)
CO2: 25 meq/L (ref 22–29)
CREATININE: 0.8 mg/dL (ref 0.6–1.1)
Calcium: 9.7 mg/dL (ref 8.4–10.4)
Chloride: 106 mEq/L (ref 98–109)
GLUCOSE: 100 mg/dL (ref 70–140)
Potassium: 4.2 mEq/L (ref 3.5–5.1)
Sodium: 142 mEq/L (ref 136–145)
Total Protein: 7.1 g/dL (ref 6.4–8.3)

## 2013-08-19 LAB — PHOSPHORUS: PHOSPHORUS: 3.2 mg/dL (ref 2.3–4.6)

## 2013-08-19 LAB — CBC WITH DIFFERENTIAL/PLATELET
BASO%: 1.2 % (ref 0.0–2.0)
Basophils Absolute: 0.1 10*3/uL (ref 0.0–0.1)
EOS%: 1.8 % (ref 0.0–7.0)
Eosinophils Absolute: 0.1 10*3/uL (ref 0.0–0.5)
HCT: 40.4 % (ref 34.8–46.6)
HGB: 13.5 g/dL (ref 11.6–15.9)
LYMPH%: 16.5 % (ref 14.0–49.7)
MCH: 30.3 pg (ref 25.1–34.0)
MCHC: 33.5 g/dL (ref 31.5–36.0)
MCV: 90.6 fL (ref 79.5–101.0)
MONO#: 0.8 10*3/uL (ref 0.1–0.9)
MONO%: 12.1 % (ref 0.0–14.0)
NEUT#: 4.4 10*3/uL (ref 1.5–6.5)
NEUT%: 68.4 % (ref 38.4–76.8)
Platelets: 227 10*3/uL (ref 145–400)
RBC: 4.45 10*6/uL (ref 3.70–5.45)
RDW: 13.2 % (ref 11.2–14.5)
WBC: 6.5 10*3/uL (ref 3.9–10.3)
lymph#: 1.1 10*3/uL (ref 0.9–3.3)

## 2013-08-19 LAB — LACTATE DEHYDROGENASE (CC13): LDH: 210 U/L (ref 125–245)

## 2013-08-19 LAB — URIC ACID (CC13): URIC ACID, SERUM: 4.8 mg/dL (ref 2.6–7.4)

## 2013-08-19 NOTE — Progress Notes (Signed)
08/19/13 9:30 am, [facility] [id number], Follow-Up Assessment, 30 Months Post Treatment:  Debra Schwartz into the [facility] for labs and to see Dr. Alvy Bimler.  She is doing well with no signs of progression and no adverse events.  She will continue to be seen every six months at this time, but will only have radiographic assessments as warranted.

## 2013-08-19 NOTE — Progress Notes (Signed)
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 In July of 2011, mammogram picked up abnormalities leading to further imaging studies. CT scan from August 2011 show lymphoma involvement both above and below the diaphragm. She had ultrasound-guided biopsy of the lymph node that came back positive for follicular lymphoma. The patient was offered clinical trial that she participated in [facility] trial with rituximab and Revlimid between October 2011 to 01/16/2011. Serial imaging studies over the past 3 years show the patient has achieved complete remission. She is currently being observed.  INTERVAL HISTORY: Debra Schwartz 58 y.o. female returns for further followup. Denies any palpable lymphadenopathy. She denies any recent fever, chills, night sweats or abnormal weight loss   I have reviewed the past medical history, past surgical history, social history and family history with the patient and they are unchanged from previous note.  ALLERGIES:  has No Known Allergies.  MEDICATIONS:  Current Outpatient Prescriptions  Medication Sig Dispense Refill  . cetirizine (ZYRTEC) 10 MG tablet Take 10 mg by mouth daily.      Marland Kitchen estrogens, conjugated, (PREMARIN) 0.45 MG tablet Take 0.45 mg by mouth daily.        . fexofenadine (ALLEGRA) 180 MG tablet Take 180 mg by mouth daily.      . fish oil-omega-3 fatty acids 1000 MG capsule Take 2 g by mouth 2 (two) times daily.        . Plant Sterols and Stanols (CHOLEST OFF PO) Take 900 mg by mouth 2 (two) times daily.       . progesterone (PROMETRIUM) 200 MG capsule Take 200 mg by mouth. Take once every 3 months for 12 days      . VITAMIN D, CHOLECALCIFEROL,  PO Take 5,000 Int'l Units by mouth daily.       No current facility-administered medications for this visit.    REVIEW OF SYSTEMS:   Constitutional: Denies fevers, chills or abnormal weight loss Eyes: Denies blurriness of vision Ears, nose, mouth, throat, and face: Denies mucositis or sore throat Respiratory: Denies cough, dyspnea or wheezes Cardiovascular: Denies palpitation, chest discomfort or lower extremity swelling Gastrointestinal:  Denies nausea, heartburn or change in bowel habits Skin: Denies abnormal skin rashes Lymphatics: Denies new lymphadenopathy or easy bruising Neurological:Denies numbness, tingling or new weaknesses Behavioral/Psych: Mood is stable, no new changes  All other systems were reviewed with the patient and are negative.  PHYSICAL EXAMINATION: ECOG PERFORMANCE STATUS: 0 - Asymptomatic  Filed Vitals:   08/19/13 0846  BP: 143/78  Pulse: 79  Temp: 97.9 F (36.6 C)  Resp: 20   Filed Weights   08/19/13 0846  Weight: 165 lb 9.6 oz (75.116 kg)    GENERAL:alert, no distress and comfortable SKIN: skin color, texture, turgor are normal, no rashes or significant lesions EYES: normal, Conjunctiva are pink and non-injected, sclera clear OROPHARYNX:no exudate, and tongue normal  NECK: supple, thyroid normal size, non-tender, without nodularity LYMPH:  no palpable lymphadenopathy in the cervical, axillary or inguinal LUNGS: clear to auscultation and percussion with normal breathing effort HEART: regular rate & rhythm and no murmurs and no lower extremity edema ABDOMEN:abdomen soft, non-tender and normal bowel sounds Musculoskeletal:no cyanosis of digits and no clubbing  NEURO: alert &  oriented x 3 with fluent speech, no focal motor/sensory deficits  LABORATORY DATA:  I have reviewed the data as listed    Component Value Date/Time   NA 141 02/22/2013 1337   NA 139 10/26/2011 0829   NA 140 06/20/2011 1433   K 4.0 02/22/2013 1337   K 4.7 10/26/2011 0829    K 3.8 06/20/2011 1433   CL 106 10/30/2012 1443   CL 102 10/26/2011 0829   CL 104 06/20/2011 1433   CO2 28 02/22/2013 1337   CO2 29 10/26/2011 0829   CO2 29 06/20/2011 1433   GLUCOSE 104 02/22/2013 1337   GLUCOSE 116* 10/30/2012 1443   GLUCOSE 101 10/26/2011 0829   GLUCOSE 102* 06/20/2011 1433   BUN 17.4 02/22/2013 1337   BUN 18 10/26/2011 0829   BUN 20 06/20/2011 1433   CREATININE 0.7 02/22/2013 1337   CREATININE 0.9 10/26/2011 0829   CREATININE 0.73 06/20/2011 1433   CALCIUM 9.1 02/22/2013 1337   CALCIUM 8.8 10/26/2011 0829   CALCIUM 9.7 06/20/2011 1433   PROT 6.9 02/22/2013 1337   PROT 6.7 10/26/2011 0829   PROT 6.7 06/20/2011 1433   ALBUMIN 3.3* 02/22/2013 1337   ALBUMIN 4.5 06/20/2011 1433   AST 16 02/22/2013 1337   AST 21 10/26/2011 0829   AST 21 06/20/2011 1433   ALT 18 02/22/2013 1337   ALT 26 10/26/2011 0829   ALT 20 06/20/2011 1433   ALKPHOS 93 02/22/2013 1337   ALKPHOS 85* 10/26/2011 0829   ALKPHOS 94 06/20/2011 1433   BILITOT 0.23 02/22/2013 1337   BILITOT 0.60 10/26/2011 0829   BILITOT 0.3 06/20/2011 1433   GFRNONAA >60 06/21/2010 0435   GFRAA  Value: >60        The eGFR has been calculated using the MDRD equation. This calculation has not been validated in all clinical situations. eGFR's persistently <60 mL/min signify possible Chronic Kidney Disease. 06/21/2010 0435    No results found for this basename: SPEP, UPEP,  kappa and lambda light chains    Lab Results  Component Value Date   WBC 6.5 08/19/2013   NEUTROABS 4.4 08/19/2013   HGB 13.5 08/19/2013   HCT 40.4 08/19/2013   MCV 90.6 08/19/2013   PLT 227 08/19/2013      Chemistry      Component Value Date/Time   NA 141 02/22/2013 1337   NA 139 10/26/2011 0829   NA 140 06/20/2011 1433   K 4.0 02/22/2013 1337   K 4.7 10/26/2011 0829   K 3.8 06/20/2011 1433   CL 106 10/30/2012 1443   CL 102 10/26/2011 0829   CL 104 06/20/2011 1433   CO2 28 02/22/2013 1337   CO2 29 10/26/2011 0829   CO2 29 06/20/2011 1433   BUN 17.4 02/22/2013 1337   BUN 18 10/26/2011  0829   BUN 20 06/20/2011 1433   CREATININE 0.7 02/22/2013 1337   CREATININE 0.9 10/26/2011 0829   CREATININE 0.73 06/20/2011 1433      Component Value Date/Time   CALCIUM 9.1 02/22/2013 1337   CALCIUM 8.8 10/26/2011 0829   CALCIUM 9.7 06/20/2011 1433   ALKPHOS 93 02/22/2013 1337   ALKPHOS 85* 10/26/2011 0829   ALKPHOS 94 06/20/2011 1433   AST 16 02/22/2013 1337   AST 21 10/26/2011 0829   AST 21 06/20/2011 1433   ALT 18 02/22/2013 1337   ALT 26 10/26/2011 0829   ALT 20 06/20/2011 1433   BILITOT 0.23 02/22/2013 1337   BILITOT 0.60 10/26/2011 0829  BILITOT 0.3 06/20/2011 1433     ASSESSMENT & PLAN:  #1 follicular lymphoma, stage IIIa She has no evidence of disease recurrence. We will continue history, physical examination, and blood work every 6 months. At this point in time, I would not recommend routine imaging study unless she has signs and symptoms to suggest disease recurrence #2 preventive care I recommend vitamin D supplementation. I recommend consideration for aspirin for prevention against risk of heart disease due to risk factor with hyperlipidemia.  Orders Placed This Encounter  Procedures  . Comprehensive metabolic panel    Standing Status: Future     Number of Occurrences:      Standing Expiration Date: 08/19/2014  . CBC with Differential    Standing Status: Future     Number of Occurrences:      Standing Expiration Date: 08/19/2014  . Lactate dehydrogenase    Standing Status: Future     Number of Occurrences:      Standing Expiration Date: 08/19/2014   All questions were answered. The patient knows to call the clinic with any problems, questions or concerns. No barriers to learning was detected. I spent 15 minutes counseling the patient face to face. The total time spent in the appointment was 20 minutes and more than 50% was on counseling and review of test results     [facility], [place], MD 08/19/2013 9:14 AM

## 2013-08-20 ENCOUNTER — Telehealth: Payer: Self-pay | Admitting: Hematology and Oncology

## 2013-08-20 NOTE — Telephone Encounter (Signed)
lvm for pt regarding to OCT appt....maield pt appt sched/avs and letter °

## 2013-12-04 ENCOUNTER — Ambulatory Visit
Admission: RE | Admit: 2013-12-04 | Discharge: 2013-12-04 | Disposition: A | Discharge status: Home / Self Care | Payer: BC Managed Care – HMO | Source: Ambulatory Visit | Attending: Internal Medicine | Admitting: Internal Medicine

## 2013-12-04 ENCOUNTER — Other Ambulatory Visit: Payer: Self-pay | Admitting: Internal Medicine

## 2013-12-04 DIAGNOSIS — E042 Nontoxic multinodular goiter: Secondary | ICD-10-CM

## 2014-02-11 ENCOUNTER — Telehealth: Payer: Self-pay | Admitting: *Deleted

## 2014-02-11 NOTE — Telephone Encounter (Signed)
Debra Schwartz called to say she cannot come to her previously scheduled appointment on 02/18/14.  Rescheduled her for lab and Dr. Alvy Bimler for 02/24/14.  She has moved to [place], [place].  She was given the new date and times.

## 2014-02-18 ENCOUNTER — Other Ambulatory Visit: Payer: Self-pay

## 2014-02-18 ENCOUNTER — Ambulatory Visit: Payer: Self-pay | Admitting: Hematology and Oncology

## 2014-02-24 ENCOUNTER — Ambulatory Visit: Payer: Self-pay | Admitting: Hematology and Oncology

## 2014-02-24 ENCOUNTER — Other Ambulatory Visit: Payer: Self-pay

## 2014-02-27 ENCOUNTER — Telehealth: Payer: Self-pay | Admitting: *Deleted

## 2014-02-27 NOTE — Telephone Encounter (Signed)
Debra Schwartz requested changing her lab/MD for 03/03/14 to 03/14/14.  She has a family obligation that prevents her from keeping the appointment.  Appointment changed and emailed new schedule.

## 2014-03-03 ENCOUNTER — Other Ambulatory Visit: Payer: Self-pay

## 2014-03-03 ENCOUNTER — Ambulatory Visit: Payer: Self-pay | Admitting: Hematology and Oncology

## 2014-03-14 ENCOUNTER — Other Ambulatory Visit (HOSPITAL_BASED_OUTPATIENT_CLINIC_OR_DEPARTMENT_OTHER): Payer: BC Managed Care – HMO

## 2014-03-14 ENCOUNTER — Ambulatory Visit (HOSPITAL_BASED_OUTPATIENT_CLINIC_OR_DEPARTMENT_OTHER): Payer: BC Managed Care – HMO | Admitting: Hematology and Oncology

## 2014-03-14 ENCOUNTER — Encounter: Payer: Self-pay | Admitting: Hematology and Oncology

## 2014-03-14 VITALS — BP 144/84 | HR 80 | Temp 98.4°F | Resp 18 | Ht 65.5 in | Wt 163.1 lb

## 2014-03-14 DIAGNOSIS — C8200 Follicular lymphoma grade I, unspecified site: Secondary | ICD-10-CM

## 2014-03-14 LAB — COMPREHENSIVE METABOLIC PANEL (CC13)
ALK PHOS: 90 U/L (ref 40–150)
ALT: 24 U/L (ref 0–55)
ANION GAP: 9 meq/L (ref 3–11)
AST: 20 U/L (ref 5–34)
Albumin: 3.7 g/dL (ref 3.5–5.0)
BILIRUBIN TOTAL: 0.21 mg/dL (ref 0.20–1.20)
BUN: 17.2 mg/dL (ref 7.0–26.0)
CO2: 24 mEq/L (ref 22–29)
CREATININE: 0.8 mg/dL (ref 0.6–1.1)
Calcium: 9.4 mg/dL (ref 8.4–10.4)
Chloride: 109 mEq/L (ref 98–109)
Glucose: 102 mg/dl (ref 70–140)
Potassium: 4.1 mEq/L (ref 3.5–5.1)
Sodium: 142 mEq/L (ref 136–145)
TOTAL PROTEIN: 7 g/dL (ref 6.4–8.3)

## 2014-03-14 LAB — CBC WITH DIFFERENTIAL/PLATELET
BASO%: 0.9 % (ref 0.0–2.0)
BASOS ABS: 0.1 10*3/uL (ref 0.0–0.1)
EOS%: 1.8 % (ref 0.0–7.0)
Eosinophils Absolute: 0.1 10*3/uL (ref 0.0–0.5)
HEMATOCRIT: 42.6 % (ref 34.8–46.6)
HEMOGLOBIN: 14 g/dL (ref 11.6–15.9)
LYMPH#: 1.8 10*3/uL (ref 0.9–3.3)
LYMPH%: 27.6 % (ref 14.0–49.7)
MCH: 29.9 pg (ref 25.1–34.0)
MCHC: 32.9 g/dL (ref 31.5–36.0)
MCV: 90.8 fL (ref 79.5–101.0)
MONO#: 0.5 10*3/uL (ref 0.1–0.9)
MONO%: 7 % (ref 0.0–14.0)
NEUT#: 4.1 10*3/uL (ref 1.5–6.5)
NEUT%: 62.7 % (ref 38.4–76.8)
PLATELETS: 271 10*3/uL (ref 145–400)
RBC: 4.69 10*6/uL (ref 3.70–5.45)
RDW: 13 % (ref 11.2–14.5)
WBC: 6.6 10*3/uL (ref 3.9–10.3)

## 2014-03-14 LAB — PHOSPHORUS: Phosphorus: 3.2 mg/dL (ref 2.3–4.6)

## 2014-03-14 LAB — LACTATE DEHYDROGENASE (CC13): LDH: 214 U/L (ref 125–245)

## 2014-03-14 LAB — URIC ACID (CC13): URIC ACID, SERUM: 4.8 mg/dL (ref 2.6–7.4)

## 2014-03-14 NOTE — Assessment & Plan Note (Signed)
Clinically, she has no signs of recurrence. We reinforced the importance of preventive care. The patient has recently moved. I will refer her to the regional [place] where she lives and I would recommend repeat follow-up in the summer next year 6-9 months from now.

## 2014-03-14 NOTE — Progress Notes (Signed)
Debra Schwartz OFFICE PROGRESS NOTE  Patient Care Team: Cari Caraway, MD as PCP - General (Family Medicine) Delrae Rend, MD as Attending Physician (Internal Medicine)  SUMMARY OF ONCOLOGIC HISTORY:  This patient was discovered to have lymphoma on a routine screening mammogram. In July of 2011, mammogram picked up abnormalities leading to further imaging studies. CT scan from August 2011 show lymphoma involvement both above and below the diaphragm. She had ultrasound-guided biopsy of the lymph node that came back positive for follicular lymphoma. The patient was offered clinical trial that she participated in [facility] trial with rituximab and Revlimid between October 2011 to 01/16/2011. Serial imaging studies over the past 3 years show the patient has achieved complete remission. She is currently being observed.  INTERVAL HISTORY: Please see below for problem oriented charting. She feels well. She denies new lymphadenopathy. She recently moved to [place].  REVIEW OF SYSTEMS:   Constitutional: Denies fevers, chills or abnormal weight loss Eyes: Denies blurriness of vision Ears, nose, mouth, throat, and face: Denies mucositis or sore throat Respiratory: Denies cough, dyspnea or wheezes Cardiovascular: Denies palpitation, chest discomfort or lower extremity swelling Gastrointestinal:  Denies nausea, heartburn or change in bowel habits Skin: Denies abnormal skin rashes Lymphatics: Denies new lymphadenopathy or easy bruising Neurological:Denies numbness, tingling or new weaknesses Behavioral/Psych: Mood is stable, no new changes  All other systems were reviewed with the patient and are negative.  I have reviewed the past medical history, past surgical history, social history and family history with the patient and they are unchanged from previous note.  ALLERGIES:  has No Known Allergies.  MEDICATIONS:  Current Outpatient Prescriptions  Medication Sig Dispense Refill  .  aspirin 81 MG chewable tablet Chew 81 mg by mouth daily.      . cetirizine (ZYRTEC) 10 MG tablet Take 10 mg by mouth daily.      Marland Kitchen estrogens, conjugated, (PREMARIN) 0.45 MG tablet Take 0.45 mg by mouth daily.        . fexofenadine (ALLEGRA) 180 MG tablet Take 180 mg by mouth daily.      . fish oil-omega-3 fatty acids 1000 MG capsule Take 2 g by mouth 2 (two) times daily.        . Plant Sterols and Stanols (CHOLEST OFF PO) Take 900 mg by mouth 2 (two) times daily.       . progesterone (PROMETRIUM) 200 MG capsule Take 200 mg by mouth. Take once every 3 months for 12 days      . VITAMIN D, CHOLECALCIFEROL, PO Take 5,000 Int'l Units by mouth daily.       No current facility-administered medications for this visit.    PHYSICAL EXAMINATION: ECOG PERFORMANCE STATUS: 0 - Asymptomatic  Filed Vitals:   03/14/14 1225  BP: 144/84  Pulse: 80  Temp: 98.4 F (36.9 C)  Resp: 18   Filed Weights   03/14/14 1225  Weight: 163 lb 1.6 oz (73.982 kg)    GENERAL:alert, no distress and comfortable SKIN: skin color, texture, turgor are normal, no rashes or significant lesions EYES: normal, Conjunctiva are pink and non-injected, sclera clear OROPHARYNX:no exudate, no erythema and lips, buccal mucosa, and tongue normal  NECK: supple, thyroid normal size, non-tender, without nodularity LYMPH:  no palpable lymphadenopathy in the cervical, axillary or inguinal LUNGS: clear to auscultation and percussion with normal breathing effort HEART: regular rate & rhythm and no murmurs and no lower extremity edema ABDOMEN:abdomen soft, non-tender and normal bowel sounds Musculoskeletal:no cyanosis of digits and  no clubbing  NEURO: alert & oriented x 3 with fluent speech, no focal motor/sensory deficits  LABORATORY DATA:  I have reviewed the data as listed    Component Value Date/Time   NA 142 03/14/2014 1211   NA 139 10/26/2011 0829   NA 140 06/20/2011 1433   K 4.1 03/14/2014 1211   K 4.7 10/26/2011 0829   K 3.8  06/20/2011 1433   CL 106 10/30/2012 1443   CL 102 10/26/2011 0829   CL 104 06/20/2011 1433   CO2 24 03/14/2014 1211   CO2 29 10/26/2011 0829   CO2 29 06/20/2011 1433   GLUCOSE 102 03/14/2014 1211   GLUCOSE 116* 10/30/2012 1443   GLUCOSE 101 10/26/2011 0829   GLUCOSE 102* 06/20/2011 1433   BUN 17.2 03/14/2014 1211   BUN 18 10/26/2011 0829   BUN 20 06/20/2011 1433   CREATININE 0.8 03/14/2014 1211   CREATININE 0.9 10/26/2011 0829   CREATININE 0.73 06/20/2011 1433   CALCIUM 9.4 03/14/2014 1211   CALCIUM 8.8 10/26/2011 0829   CALCIUM 9.7 06/20/2011 1433   PROT 7.0 03/14/2014 1211   PROT 6.7 10/26/2011 0829   PROT 6.7 06/20/2011 1433   ALBUMIN 3.7 03/14/2014 1211   ALBUMIN 4.5 06/20/2011 1433   AST 20 03/14/2014 1211   AST 21 10/26/2011 0829   AST 21 06/20/2011 1433   ALT 24 03/14/2014 1211   ALT 26 10/26/2011 0829   ALT 20 06/20/2011 1433   ALKPHOS 90 03/14/2014 1211   ALKPHOS 85* 10/26/2011 0829   ALKPHOS 94 06/20/2011 1433   BILITOT 0.21 03/14/2014 1211   BILITOT 0.60 10/26/2011 0829   BILITOT 0.3 06/20/2011 1433   GFRNONAA >60 06/21/2010 0435   GFRAA  Value: >60        The eGFR has been calculated using the MDRD equation. This calculation has not been validated in all clinical situations. eGFR's persistently <60 mL/min signify possible Chronic Kidney Disease. 06/21/2010 0435    No results found for this basename: SPEP,  UPEP,   kappa and lambda light chains    Lab Results  Component Value Date   WBC 6.6 03/14/2014   NEUTROABS 4.1 03/14/2014   HGB 14.0 03/14/2014   HCT 42.6 03/14/2014   MCV 90.8 03/14/2014   PLT 271 03/14/2014      Chemistry      Component Value Date/Time   NA 142 03/14/2014 1211   NA 139 10/26/2011 0829   NA 140 06/20/2011 1433   K 4.1 03/14/2014 1211   K 4.7 10/26/2011 0829   K 3.8 06/20/2011 1433   CL 106 10/30/2012 1443   CL 102 10/26/2011 0829   CL 104 06/20/2011 1433   CO2 24 03/14/2014 1211   CO2 29 10/26/2011 0829   CO2 29 06/20/2011 1433   BUN 17.2 03/14/2014 1211   BUN 18  10/26/2011 0829   BUN 20 06/20/2011 1433   CREATININE 0.8 03/14/2014 1211   CREATININE 0.9 10/26/2011 0829   CREATININE 0.73 06/20/2011 1433      Component Value Date/Time   CALCIUM 9.4 03/14/2014 1211   CALCIUM 8.8 10/26/2011 0829   CALCIUM 9.7 06/20/2011 1433   ALKPHOS 90 03/14/2014 1211   ALKPHOS 85* 10/26/2011 0829   ALKPHOS 94 06/20/2011 1433   AST 20 03/14/2014 1211   AST 21 10/26/2011 0829   AST 21 06/20/2011 1433   ALT 24 03/14/2014 1211   ALT 26 10/26/2011 0829   ALT 20 06/20/2011 1433   BILITOT 0.21  03/14/2014 1211   BILITOT 0.60 10/26/2011 0829   BILITOT 0.3 06/20/2011 1433      ASSESSMENT & PLAN:  Follicular lymphoma grade I Clinically, she has no signs of recurrence. We reinforced the importance of preventive care. The patient has recently moved. I will refer her to the regional [facility] where she lives and I would recommend repeat follow-up in the summer next year 6-9 months from now.    No orders of the defined types were placed in this encounter.   All questions were answered. The patient knows to call the clinic with any problems, questions or concerns. No barriers to learning was detected. I spent 15 minutes counseling the patient face to face. The total time spent in the appointment was 20 minutes and more than 50% was on counseling and review of test results     [facility], [place], MD 03/14/2014 4:06 PM

## 2014-03-19 ENCOUNTER — Telehealth: Payer: Self-pay | Admitting: Hematology and Oncology

## 2014-03-19 NOTE — Telephone Encounter (Signed)
Pt appt. To see Dr. Rolena Infante in [place], [place] in 09/18/14@10 :51. Medical records faxed. Pt is aware
